# Patient Record
Sex: Female | Born: 1970 | Race: White | Hispanic: No | Marital: Married | Smoking: Never smoker
Health system: Southern US, Community
[De-identification: ages and names within clinical notes are randomized; demographics above are authoritative.]

## PROBLEM LIST (undated history)

## (undated) DIAGNOSIS — K219 Gastro-esophageal reflux disease without esophagitis: Secondary | ICD-10-CM

## (undated) DIAGNOSIS — E119 Type 2 diabetes mellitus without complications: Secondary | ICD-10-CM

## (undated) DIAGNOSIS — J45909 Unspecified asthma, uncomplicated: Secondary | ICD-10-CM

## (undated) DIAGNOSIS — E785 Hyperlipidemia, unspecified: Secondary | ICD-10-CM

## (undated) DIAGNOSIS — I10 Essential (primary) hypertension: Secondary | ICD-10-CM

## (undated) DIAGNOSIS — F419 Anxiety disorder, unspecified: Secondary | ICD-10-CM

## (undated) HISTORY — DX: Hyperlipidemia, unspecified: E78.5

## (undated) HISTORY — PX: LAPAROSCOPIC OVARIAN: SHX5906

## (undated) HISTORY — DX: Essential (primary) hypertension: I10

---

## 2000-09-13 HISTORY — PX: APPENDECTOMY: SHX54

## 2005-10-18 ENCOUNTER — Observation Stay: Payer: Self-pay | Admitting: Certified Nurse Midwife

## 2005-11-03 ENCOUNTER — Observation Stay: Payer: Self-pay | Admitting: Obstetrics and Gynecology

## 2005-11-09 ENCOUNTER — Observation Stay: Payer: Self-pay | Admitting: Obstetrics and Gynecology

## 2005-11-10 ENCOUNTER — Observation Stay: Payer: Self-pay

## 2005-11-12 ENCOUNTER — Observation Stay: Payer: Self-pay | Admitting: Obstetrics and Gynecology

## 2005-11-14 ENCOUNTER — Observation Stay: Payer: Self-pay | Admitting: Obstetrics and Gynecology

## 2005-11-15 ENCOUNTER — Inpatient Hospital Stay: Payer: Self-pay | Admitting: Obstetrics and Gynecology

## 2005-11-19 ENCOUNTER — Ambulatory Visit: Payer: Self-pay | Admitting: Pediatrics

## 2007-07-11 ENCOUNTER — Ambulatory Visit: Payer: Self-pay

## 2007-07-13 ENCOUNTER — Ambulatory Visit: Payer: Self-pay

## 2007-08-08 ENCOUNTER — Ambulatory Visit: Payer: Self-pay | Admitting: General Surgery

## 2008-01-11 DIAGNOSIS — J309 Allergic rhinitis, unspecified: Secondary | ICD-10-CM | POA: Insufficient documentation

## 2008-02-05 ENCOUNTER — Ambulatory Visit: Payer: Self-pay | Admitting: General Surgery

## 2009-10-07 DIAGNOSIS — H6990 Unspecified Eustachian tube disorder, unspecified ear: Secondary | ICD-10-CM | POA: Insufficient documentation

## 2010-02-04 ENCOUNTER — Ambulatory Visit: Payer: Self-pay

## 2010-12-29 ENCOUNTER — Ambulatory Visit: Payer: Self-pay | Admitting: Obstetrics and Gynecology

## 2011-01-04 ENCOUNTER — Ambulatory Visit: Payer: Self-pay | Admitting: Obstetrics and Gynecology

## 2011-01-04 HISTORY — PX: ABDOMINAL HYSTERECTOMY: SHX81

## 2011-01-07 LAB — PATHOLOGY REPORT

## 2011-09-22 ENCOUNTER — Ambulatory Visit: Payer: Self-pay | Admitting: Family Medicine

## 2012-01-05 ENCOUNTER — Ambulatory Visit: Payer: Self-pay | Admitting: Family Medicine

## 2012-01-18 ENCOUNTER — Ambulatory Visit: Payer: Self-pay | Admitting: General Surgery

## 2012-09-26 ENCOUNTER — Ambulatory Visit: Payer: Self-pay | Admitting: Family Medicine

## 2013-02-08 ENCOUNTER — Other Ambulatory Visit: Payer: Self-pay | Admitting: General Surgery

## 2013-02-08 NOTE — Telephone Encounter (Signed)
There is a RX request for Prilosec. Treated for H pylori last year. Ask if she is still using this and her symptoms per Dr Evette Cristal.  She states that yes she has been on this medications and it helps her reflux.  She will check with Valetta Mole FNP ( her PCP) to see if they will begin to prescribe, if not she will call us back.

## 2013-02-09 NOTE — Telephone Encounter (Signed)
Patient seen in May 2013 with abdominal pain. Neg u/s, HIDA w/ EF of 95%, no symptoms during exam. Found to have elevated H pylori titer. RX w/ Omeprazole, Amox and Biaxin for infection. No phone f/u as requested. Patient will be asked to contact her primary care provider for further refills.  Cc: Lorie Phenix, MD

## 2013-08-03 ENCOUNTER — Ambulatory Visit: Payer: Self-pay | Admitting: Family Medicine

## 2013-10-08 ENCOUNTER — Ambulatory Visit: Payer: Self-pay | Admitting: Family Medicine

## 2013-11-23 LAB — LIPID PANEL
Cholesterol: 182 mg/dL (ref 0–200)
HDL: 51 mg/dL (ref 35–70)
LDL Cholesterol: 117 mg/dL
LDl/HDL Ratio: 4.6
Triglycerides: 72 mg/dL (ref 40–160)

## 2014-07-02 ENCOUNTER — Ambulatory Visit: Payer: Self-pay | Admitting: Family Medicine

## 2014-11-01 ENCOUNTER — Emergency Department: Payer: Self-pay | Admitting: Emergency Medicine

## 2014-11-04 LAB — HEMOGLOBIN A1C: Hgb A1c MFr Bld: 6.2 % — AB (ref 4.0–6.0)

## 2014-11-04 LAB — TSH: TSH: 2.22 u[IU]/mL (ref <=5.90)

## 2014-11-12 LAB — HM PAP SMEAR: HM PAP: NEGATIVE

## 2014-11-15 ENCOUNTER — Ambulatory Visit: Payer: Self-pay | Admitting: Family Medicine

## 2014-12-14 DIAGNOSIS — G2581 Restless legs syndrome: Secondary | ICD-10-CM | POA: Insufficient documentation

## 2014-12-14 DIAGNOSIS — E282 Polycystic ovarian syndrome: Secondary | ICD-10-CM | POA: Insufficient documentation

## 2014-12-14 DIAGNOSIS — F329 Major depressive disorder, single episode, unspecified: Secondary | ICD-10-CM | POA: Insufficient documentation

## 2014-12-14 DIAGNOSIS — J45909 Unspecified asthma, uncomplicated: Secondary | ICD-10-CM | POA: Insufficient documentation

## 2014-12-14 DIAGNOSIS — F32A Depression, unspecified: Secondary | ICD-10-CM | POA: Insufficient documentation

## 2014-12-14 DIAGNOSIS — E7800 Pure hypercholesterolemia, unspecified: Secondary | ICD-10-CM | POA: Insufficient documentation

## 2014-12-14 DIAGNOSIS — IMO0002 Reserved for concepts with insufficient information to code with codable children: Secondary | ICD-10-CM | POA: Insufficient documentation

## 2014-12-14 DIAGNOSIS — G4700 Insomnia, unspecified: Secondary | ICD-10-CM | POA: Insufficient documentation

## 2014-12-14 DIAGNOSIS — F419 Anxiety disorder, unspecified: Secondary | ICD-10-CM | POA: Insufficient documentation

## 2014-12-14 DIAGNOSIS — E288 Other ovarian dysfunction: Secondary | ICD-10-CM | POA: Insufficient documentation

## 2014-12-14 DIAGNOSIS — K219 Gastro-esophageal reflux disease without esophagitis: Secondary | ICD-10-CM | POA: Insufficient documentation

## 2014-12-14 DIAGNOSIS — Z8619 Personal history of other infectious and parasitic diseases: Secondary | ICD-10-CM | POA: Insufficient documentation

## 2014-12-14 DIAGNOSIS — R202 Paresthesia of skin: Secondary | ICD-10-CM | POA: Insufficient documentation

## 2014-12-14 DIAGNOSIS — F4320 Adjustment disorder, unspecified: Secondary | ICD-10-CM | POA: Insufficient documentation

## 2015-02-13 ENCOUNTER — Ambulatory Visit: Payer: Self-pay | Admitting: Family Medicine

## 2015-02-26 ENCOUNTER — Other Ambulatory Visit: Payer: Self-pay | Admitting: *Deleted

## 2015-02-26 MED ORDER — LOVASTATIN 20 MG PO TABS: 20.0000 mg | ORAL_TABLET | Freq: Every day | ORAL | Status: DC

## 2015-02-26 NOTE — Telephone Encounter (Signed)
Refill request for Lovastatin 20 mg Last filled by MD on- 07/09/2014 #30 x5 refills Last Appt: 11/12/2014 Next Appt: none Please advise refill?

## 2015-03-03 ENCOUNTER — Other Ambulatory Visit: Payer: Self-pay

## 2015-03-03 DIAGNOSIS — E7800 Pure hypercholesterolemia, unspecified: Secondary | ICD-10-CM

## 2015-03-03 MED ORDER — LOVASTATIN 20 MG PO TABS: 20.0000 mg | ORAL_TABLET | Freq: Every day | ORAL | Status: DC

## 2015-05-12 ENCOUNTER — Ambulatory Visit: Payer: Self-pay | Admitting: Family Medicine

## 2015-05-13 ENCOUNTER — Encounter: Payer: Self-pay | Admitting: Family Medicine

## 2015-05-13 ENCOUNTER — Ambulatory Visit (INDEPENDENT_AMBULATORY_CARE_PROVIDER_SITE_OTHER): Payer: BLUE CROSS/BLUE SHIELD | Admitting: Family Medicine

## 2015-05-13 VITALS — BP 102/74 | HR 80 | Temp 98.1°F | Resp 16 | Ht 63.25 in | Wt 250.0 lb

## 2015-05-13 DIAGNOSIS — R7309 Other abnormal glucose: Secondary | ICD-10-CM | POA: Diagnosis not present

## 2015-05-13 DIAGNOSIS — F32A Depression, unspecified: Secondary | ICD-10-CM

## 2015-05-13 DIAGNOSIS — E288 Other ovarian dysfunction: Secondary | ICD-10-CM | POA: Diagnosis not present

## 2015-05-13 DIAGNOSIS — F329 Major depressive disorder, single episode, unspecified: Secondary | ICD-10-CM

## 2015-05-13 DIAGNOSIS — R7303 Prediabetes: Secondary | ICD-10-CM

## 2015-05-13 DIAGNOSIS — IMO0002 Reserved for concepts with insufficient information to code with codable children: Secondary | ICD-10-CM

## 2015-05-13 MED ORDER — ALPRAZOLAM 0.5 MG PO TABS: 0.5000 mg | ORAL_TABLET | Freq: Three times a day (TID) | ORAL | Status: AC | PRN

## 2015-05-13 MED ORDER — CITALOPRAM HYDROBROMIDE 20 MG PO TABS: 20.0000 mg | ORAL_TABLET | Freq: Every day | ORAL | Status: DC

## 2015-05-13 MED ORDER — METFORMIN HCL 500 MG PO TABS: 500.0000 mg | ORAL_TABLET | Freq: Two times a day (BID) | ORAL | Status: DC

## 2015-05-13 NOTE — Progress Notes (Signed)
Subjective:    Patient ID: Tina Henderson, female    DOB: 08-06-1971, 44 y.o.   MRN: [id number]  Anxiety Presents for follow-up visit. Symptoms include insomnia and restlessness. Patient reports no chest pain, compulsions, confusion, decreased concentration, depressed mood, dizziness, dry mouth, excessive worry, feeling of choking, hyperventilation, irritability, malaise, muscle tension, nausea, nervous/anxious behavior, obsessions, palpitations, panic, shortness of breath or suicidal ideas. The quality of sleep is poor. Nighttime awakenings: several.   Treatments tried: Citalopram and Alprazolam (pt needs refills on both of these medications) Compliance with prior treatments has been variable (Pt out of Alprazolam rx).    Obesity: Patient complains of obesity. Patient cites health as reasons for wanting to lose weight. Current BMI is 44. Pt asking if there is any OTC supplements, etc she can try for this problems.  Obesity History Weight in late teens: 125 lb. Period of greatest weight gain: 30 lb during early adult years (after having son) Lowest adult weight: unsure Highest adult weight: 250 lb (now) Amount of time at present weight: Has been in the 200's since 2000.   History of Weight Loss Efforts Greatest amount of weight lost: 18 lb over 12 months Amount of time that loss was maintained: "not all that long"  Circumstances associated with regain of weight: changed jobs, now has desk job Successful weight loss techniques attempted: [employer] Unsuccessful weight loss techniques attempted: [employer]  Current Exercise Habits walking around the neighborhood "a couple times a week"  Current Eating Habits Number of regular meals per day: 3 Number of snacking episodes per day: 1 Who shops for food? patient Who prepares food? patient Who eats with patient? patient, daughter and spouse Binge behavior?: In the past, after losing parents, but has since ceased this  behavior. Purge behavior? no Anorexic behavior? no Eating precipitated by stress? yes - after parents' deaths Guilt feelings associated with eating? no     Review of Systems  Constitutional: Negative for irritability.  Respiratory: Negative for shortness of breath.   Cardiovascular: Negative for chest pain and palpitations.  Gastrointestinal: Negative for nausea.  Neurological: Negative for dizziness.  Psychiatric/Behavioral: Negative for suicidal ideas, confusion and decreased concentration. The patient has insomnia. The patient is not nervous/anxious.    BP 102/74 mmHg  Pulse 80  Temp(Src) 98.1 F (36.7 C) (Oral)  Resp 16  Ht 5' 3.25" (1.607 m)  Wt 250 lb (113.399 kg)  BMI 43.91 kg/m2   Patient Active Problem List   Diagnosis Date Noted  . Adaptation reaction 12/14/2014  . Anxiety disorder 12/14/2014  . Airway hyperreactivity 12/14/2014  . Body mass index of 60 or higher 12/14/2014  . History of chicken pox 12/14/2014  . Clinical depression 12/14/2014  . 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 Acid reflux 12/14/2014  . Hypercholesteremia 12/14/2014  . Cannot sleep 12/14/2014  . Burning or prickling sensation 12/14/2014  . Bilateral polycystic ovarian syndrome 12/14/2014  . Borderline diabetes 12/14/2014  . Restless leg 12/14/2014  . Irregular bleeding 04/02/2010  . Auditory tube disorder 10/07/2009  . Avitaminosis D 01/30/2009  . Allergic rhinitis 01/11/2008  . Essential (primary) hypertension 01/11/2008   No past medical history on file. Current Outpatient Prescriptions on File Prior to Visit  Medication Sig  . ALPRAZolam (XANAX) 0.5 MG tablet Take 1 tablet by mouth every 8 (eight) hours as needed.  . citalopram (CELEXA) 20 MG tablet Take 1 tablet by mouth daily.  . fluticasone (FLONASE) 50 MCG/ACT nasal spray Place 2 sprays into  the nose daily.  Marland Kitchen lovastatin (MEVACOR) 20 MG tablet Take 1 tablet (20 mg total) by mouth daily.  Marland Kitchen  triamterene-hydrochlorothiazide (MAXZIDE-25) 37.5-25 MG per tablet Take 1 tablet by mouth daily.  . Vitamin D, Ergocalciferol, (DRISDOL) 50000 UNITS CAPS capsule Take 1 capsule by mouth every 30 (thirty) days.  Marland Kitchen albuterol (PROVENTIL HFA;VENTOLIN HFA) 108 (90 BASE) MCG/ACT inhaler Inhale 2 puffs into the lungs every 6 (six) hours as needed. May also take 30-45 minutes before exercise.  . mometasone-formoterol (DULERA) 200-5 MCG/ACT AERO Inhale 2 puffs into the lungs 2 (two) times daily.  Marland Kitchen omeprazole (PRILOSEC) 40 MG capsule Take 1 capsule by mouth daily.   No current facility-administered medications on file prior to visit.   No Known Allergies Past Surgical History  Procedure Laterality Date  . Appendectomy  2002  . Abdominal hysterectomy  01/04/2011    Due to Endometriosis   Social History   Social History  . Marital Status: Married    Spouse Name: N/A  . Number of Children: N/A  . Years of Education: N/A   Occupational History  . Not on file.   Social History Main Topics  . Smoking status: Never Smoker   . Smokeless tobacco: Never Used  . Alcohol Use: No  . Drug Use: No  . Sexual Activity: Not on file   Other Topics Concern  . Not on file   Social History Narrative   Family History  Problem Relation Age of Onset  . COPD Mother   . Lung cancer Father   . Bone cancer Father        Objective:   Physical Exam  Constitutional: She is oriented to person, place, and time. She appears well-developed and well-nourished.  Cardiovascular: Normal rate.   Pulmonary/Chest: Effort normal and breath sounds normal.  Neurological: She is alert and oriented to person, place, and time.   BP 102/74 mmHg  Pulse 80  Temp(Src) 98.1 F (36.7 C) (Oral)  Resp 16  Ht 5' 3.25" (1.607 m)  Wt 250 lb (113.399 kg)  BMI 43.91 kg/m2     Assessment & Plan:  1. 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  2. Borderline diabetes Worsening. Concerned about weight loss. -  metFORMIN (GLUCOPHAGE) 500 MG tablet; Take 1 tablet (500 mg total) by mouth 2 (two) times daily with a meal.  Dispense: 180 tablet; Refill: 3  3. Body mass index of 60 or higher Motivated to loose weight.  Will continue to try to loose weight.   4. Clinical depression Condition is stable. Please continue current medication and  plan of care as noted.   - ALPRAZolam (XANAX) 0.5 MG tablet; Take 1 tablet (0.5 mg total) by mouth 3 (three) times daily as needed.  Dispense: 60 tablet; Refill: 0 - citalopram (CELEXA) 20 MG tablet; Take 1 tablet (20 mg total) by mouth daily.  Dispense: 90 tablet; Refill: 3  Margarita Rana, MD

## 2015-06-10 ENCOUNTER — Telehealth: Payer: Self-pay | Admitting: Family Medicine

## 2015-06-10 NOTE — Telephone Encounter (Signed)
Please triage. Really not sure why this is happening. Can make follow up ov to discuss. Does patient think related to medication?

## 2015-06-10 NOTE — Telephone Encounter (Signed)
Pt is having a lot of sweating and is hot all the time.  Latest Blood pressure has been 138/100 then dropped down 1 hr later 122/88.  Please advise.  Her call back is [phone]  Thanks teri

## 2015-06-11 NOTE — Telephone Encounter (Signed)
I spoke with Ms Brandstetter, she wasn't sure if Metformin was causing her symptoms.  She says she checks her occasionally and it is usually normal.  I offered to schedule a follow up appointment, but she says she has a follow up scheduled at the end of October and she would like to try to wait until then.  I advised her to keep a check on her blood pressure and if it goes up again to make an appointment sooner.    Thanks,   -Mickel Baas

## 2015-06-11 NOTE — Telephone Encounter (Signed)
LMTCB 06/11/2015  Thanks,   -Mickel Baas

## 2015-06-11 NOTE — Telephone Encounter (Signed)
Pt is returning call.  IH#[phone]/[id number]

## 2015-07-08 ENCOUNTER — Ambulatory Visit: Payer: BLUE CROSS/BLUE SHIELD | Admitting: Family Medicine

## 2015-07-15 ENCOUNTER — Ambulatory Visit: Payer: BLUE CROSS/BLUE SHIELD | Admitting: Family Medicine

## 2015-07-16 ENCOUNTER — Ambulatory Visit: Payer: BLUE CROSS/BLUE SHIELD | Admitting: Family Medicine

## 2015-08-05 ENCOUNTER — Other Ambulatory Visit: Payer: Self-pay | Admitting: Family Medicine

## 2015-08-05 DIAGNOSIS — F32A Depression, unspecified: Secondary | ICD-10-CM

## 2015-08-05 DIAGNOSIS — F329 Major depressive disorder, single episode, unspecified: Secondary | ICD-10-CM

## 2015-09-17 ENCOUNTER — Ambulatory Visit: Payer: BLUE CROSS/BLUE SHIELD | Admitting: Physician Assistant

## 2015-10-06 ENCOUNTER — Ambulatory Visit (INDEPENDENT_AMBULATORY_CARE_PROVIDER_SITE_OTHER): Payer: BLUE CROSS/BLUE SHIELD | Admitting: Family Medicine

## 2015-10-06 ENCOUNTER — Encounter: Payer: Self-pay | Admitting: Family Medicine

## 2015-10-06 VITALS — BP 110/80 | HR 68 | Temp 97.9°F | Resp 16 | Wt 258.4 lb

## 2015-10-06 DIAGNOSIS — S39012A Strain of muscle, fascia and tendon of lower back, initial encounter: Secondary | ICD-10-CM

## 2015-10-06 MED ORDER — CYCLOBENZAPRINE HCL 5 MG PO TABS: 5.0000 mg | ORAL_TABLET | Freq: Three times a day (TID) | ORAL | Status: DC | PRN

## 2015-10-06 MED ORDER — HYDROCODONE-ACETAMINOPHEN 5-325 MG PO TABS: ORAL_TABLET | ORAL | Status: DC

## 2015-10-06 NOTE — Progress Notes (Signed)
Subjective:     Patient ID: Tina Henderson, female   DOB: 31-Mar-1971, 45 y.o.   MRN: [id number]  HPI  Chief Complaint  Patient presents with  . Back Pain    Patient comes in office today with concerns of lower back pain since the weekend. Patient described pain as achy and states that it is shooting down her left hip and leg. Patient has tried otc Tylenol with no relief,  Denies specific injury or hx of back surgery. Her job does not involve lifting.   Review of Systems     Objective:   Physical Exam  Constitutional: She appears well-developed and well-nourished. She appears distressed (mild back pain when changing positions.).  Musculoskeletal:  Muscle strength in lower extremities 5/5. SLR's to 90 degrees without radiation of back pain.localizes pain to her lower lumbar vertebral area-non-tender to the touch.       Assessment:    1. Low back strain, initial encounter - HYDROcodone-acetaminophen (NORCO/VICODIN) 5-325 MG tablet; One every 4-6 hours as needed for pain  Dispense: 28 tablet; Refill: 0 - cyclobenzaprine (FLEXERIL) 5 MG tablet; Take 1 tablet (5 mg total) by mouth 3 (three) times daily as needed for muscle spasms.  Dispense: 30 tablet; Refill: 0    Plan:    Discussed scheduling nsaid's.

## 2015-10-06 NOTE — Patient Instructions (Signed)
Discussed use to two  Aleve twice daily with food.

## 2015-10-07 ENCOUNTER — Other Ambulatory Visit: Payer: Self-pay

## 2015-10-07 DIAGNOSIS — I10 Essential (primary) hypertension: Secondary | ICD-10-CM

## 2015-10-07 MED ORDER — TRIAMTERENE-HCTZ 37.5-25 MG PO TABS: 1.0000 | ORAL_TABLET | Freq: Every day | ORAL | Status: DC

## 2015-10-20 ENCOUNTER — Other Ambulatory Visit: Payer: Self-pay | Admitting: Family Medicine

## 2015-10-20 DIAGNOSIS — Z1231 Encounter for screening mammogram for malignant neoplasm of breast: Secondary | ICD-10-CM

## 2015-11-10 ENCOUNTER — Ambulatory Visit
Admission: RE | Admit: 2015-11-10 | Discharge: 2015-11-10 | Disposition: A | Discharge status: Home / Self Care | Payer: BLUE CROSS/BLUE SHIELD | Source: Ambulatory Visit | Attending: Family Medicine | Admitting: Family Medicine

## 2015-11-10 ENCOUNTER — Ambulatory Visit (INDEPENDENT_AMBULATORY_CARE_PROVIDER_SITE_OTHER): Payer: BLUE CROSS/BLUE SHIELD | Admitting: Family Medicine

## 2015-11-10 ENCOUNTER — Encounter: Payer: Self-pay | Admitting: Family Medicine

## 2015-11-10 VITALS — BP 118/82 | HR 76 | Temp 98.4°F | Resp 16 | Wt 260.0 lb

## 2015-11-10 DIAGNOSIS — M545 Low back pain, unspecified: Secondary | ICD-10-CM

## 2015-11-10 DIAGNOSIS — S39012A Strain of muscle, fascia and tendon of lower back, initial encounter: Secondary | ICD-10-CM

## 2015-11-10 DIAGNOSIS — M4046 Postural lordosis, lumbar region: Secondary | ICD-10-CM | POA: Diagnosis not present

## 2015-11-10 LAB — POCT URINALYSIS DIPSTICK
Blood, UA: NEGATIVE
Glucose, UA: NEGATIVE
KETONES UA: NEGATIVE
LEUKOCYTES UA: NEGATIVE
NITRITE UA: NEGATIVE
Spec Grav, UA: 1.02
Urobilinogen, UA: 0.2
pH, UA: 7.5

## 2015-11-10 MED ORDER — CYCLOBENZAPRINE HCL 5 MG PO TABS: 5.0000 mg | ORAL_TABLET | Freq: Every day | ORAL | Status: DC

## 2015-11-10 NOTE — Progress Notes (Signed)
Subjective:    Patient ID: Tina Henderson, female    DOB: 1971/05/09, 45 y.o.   MRN: [id number]  Back Pain This is a new problem. The current episode started in the past 7 days. The problem is unchanged. The pain is present in the gluteal (right side). The quality of the pain is described as aching. The pain does not radiate. The pain is at a severity of 5/10. The pain is moderate. The symptoms are aggravated by sitting. Associated symptoms include abdominal pain and headaches (intermittent). Pertinent negatives include no bladder incontinence, bowel incontinence, chest pain, dysuria, fever, leg pain, pelvic pain, tingling or weakness. She has tried NSAIDs for the symptoms. The treatment provided no relief.  Pt was seen by Mikki Santee recently for low back strain, and was prescribed Flexeril and Norco for this problem. Pt is no longer taking these mediations, because the pain does not feel similar.    Review of Systems  Constitutional: Positive for fatigue. Negative for fever, chills and diaphoresis.  Cardiovascular: Negative for chest pain.  Gastrointestinal: Positive for abdominal pain. Negative for bowel incontinence.  Genitourinary: Positive for frequency (possibly due to BP medication). Negative for bladder incontinence, dysuria and pelvic pain.  Musculoskeletal: Positive for back pain.  Neurological: Positive for headaches (intermittent). Negative for tingling and weakness.   BP 118/82 mmHg  Pulse 76  Temp(Src) 98.4 F (36.9 C) (Oral)  Resp 16  Wt 260 lb (117.935 kg)   Patient Active Problem List   Diagnosis Date Noted  . Adaptation reaction 12/14/2014  . Anxiety disorder 12/14/2014  . Airway hyperreactivity 12/14/2014  . Body mass index of 60 or higher ([place]) 12/14/2014  . History of chicken pox 12/14/2014  . Clinical depression 12/14/2014  . 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 Acid reflux 12/14/2014  . Hypercholesteremia 12/14/2014  . Cannot sleep 12/14/2014  .  Burning or prickling sensation 12/14/2014  . Bilateral polycystic ovarian syndrome 12/14/2014  . Borderline diabetes 12/14/2014  . Restless leg 12/14/2014  . Irregular bleeding 04/02/2010  . Auditory tube disorder 10/07/2009  . Avitaminosis D 01/30/2009  . Allergic rhinitis 01/11/2008  . Essential (primary) hypertension 01/11/2008   No past medical history on file. Current Outpatient Prescriptions on File Prior to Visit  Medication Sig  . albuterol (PROVENTIL HFA;VENTOLIN HFA) 108 (90 BASE) MCG/ACT inhaler Inhale 2 puffs into the lungs every 6 (six) hours as needed. May also take 30-45 minutes before exercise.  Marland Kitchen ALPRAZolam (XANAX) 0.5 MG tablet Take 1 tablet (0.5 mg total) by mouth 3 (three) times daily as needed.  . citalopram (CELEXA) 20 MG tablet TAKE ONE AND ONE-HALF TABLETS BY MOUTH ONCE DAILY  . fluticasone (FLONASE) 50 MCG/ACT nasal spray Place 2 sprays into the nose daily.  Marland Kitchen lovastatin (MEVACOR) 20 MG tablet Take 1 tablet (20 mg total) by mouth daily.  Marland Kitchen triamterene-hydrochlorothiazide (MAXZIDE-25) 37.5-25 MG tablet Take 1 tablet by mouth daily.  . Vitamin D, Ergocalciferol, (DRISDOL) 50000 UNITS CAPS capsule Take 1 capsule by mouth every 30 (thirty) days.  . cyclobenzaprine (FLEXERIL) 5 MG tablet Take 1 tablet (5 mg total) by mouth 3 (three) times daily as needed for muscle spasms. (Patient not taking: Reported on 11/10/2015)  . HYDROcodone-acetaminophen (NORCO/VICODIN) 5-325 MG tablet One every 4-6 hours as needed for pain (Patient not taking: Reported on 11/10/2015)  . omeprazole (PRILOSEC) 40 MG capsule Take 1 capsule by mouth daily. Reported on 11/10/2015   No current facility-administered medications on file prior to visit.   No  Known Allergies Past Surgical History  Procedure Laterality Date  . Appendectomy  2002  . Abdominal hysterectomy  01/04/2011    Due to Endometriosis   Social History   Social History  . Marital Status: Married    Spouse Name: N/A  . Number  of Children: N/A  . Years of Education: N/A   Occupational History  . Not on file.   Social History Main Topics  . Smoking status: Never Smoker   . Smokeless tobacco: Never Used  . Alcohol Use: No  . Drug Use: No  . Sexual Activity: Not on file   Other Topics Concern  . Not on file   Social History Narrative   Family History  Problem Relation Age of Onset  . COPD Mother   . Lung cancer Father   . Bone cancer Father        Objective:   Physical Exam  Constitutional: She is oriented to person, place, and time. She appears well-developed and well-nourished.  Abdominal: Soft. Bowel sounds are normal. There is no tenderness.  No CVA tenderness  Musculoskeletal: Normal range of motion.  Negative leg lifts. Normal strength of legs.  Neurological: She is alert and oriented to person, place, and time.  Psychiatric: She has a normal mood and affect. Her behavior is normal.   BP 118/82 mmHg  Pulse 76  Temp(Src) 98.4 F (36.9 C) (Oral)  Resp 16  Wt 260 lb (117.935 kg)     Assessment & Plan:  1. Right-sided low back pain without sciatica New problem. UA negative. Will obtain XRay. FU pending results.  - POCT urinalysis dipstick - DG Lumbar Spine 2-3 Views; Future Results for orders placed or performed in visit on 11/10/15  POCT urinalysis dipstick  Result Value Ref Range   Color, UA Amber    Clarity, UA Clear    Glucose, UA Negative    Bilirubin, UA Small    Ketones, UA Negative    Spec Grav, UA 1.020    Blood, UA Negative    pH, UA 7.5    Protein, UA Trace    Urobilinogen, UA 0.2    Nitrite, UA Negative    Leukocytes, UA Negative Negative     2. Back strain, initial encounter Recurrent. Restart Cyclobenzaprine and refer to PT as below. - Ambulatory referral to Physical Therapy - cyclobenzaprine (FLEXERIL) 5 MG tablet; Take 1 tablet (5 mg total) by mouth at bedtime.  Dispense: 30 tablet; Refill: 0  Patient seen and examined by Jerrell Belfast, MD, and note  scribed by Renaldo Fiddler, CMA.   I have reviewed the document for accuracy and completeness and I agree with above. Jerrell Belfast, MD   Margarita Rana, MD

## 2015-11-14 ENCOUNTER — Ambulatory Visit (INDEPENDENT_AMBULATORY_CARE_PROVIDER_SITE_OTHER): Payer: BLUE CROSS/BLUE SHIELD | Admitting: Family Medicine

## 2015-11-14 ENCOUNTER — Encounter: Payer: Self-pay | Admitting: Family Medicine

## 2015-11-14 VITALS — BP 128/84 | HR 96 | Temp 98.2°F | Resp 16 | Ht 63.0 in | Wt 260.0 lb

## 2015-11-14 DIAGNOSIS — F329 Major depressive disorder, single episode, unspecified: Secondary | ICD-10-CM

## 2015-11-14 DIAGNOSIS — I10 Essential (primary) hypertension: Secondary | ICD-10-CM

## 2015-11-14 DIAGNOSIS — E7800 Pure hypercholesterolemia, unspecified: Secondary | ICD-10-CM | POA: Diagnosis not present

## 2015-11-14 DIAGNOSIS — F32A Depression, unspecified: Secondary | ICD-10-CM

## 2015-11-14 DIAGNOSIS — Z0000 Encounter for general adult medical examination without abnormal findings: Secondary | ICD-10-CM | POA: Diagnosis not present

## 2015-11-14 MED ORDER — TRIAMTERENE-HCTZ 37.5-25 MG PO TABS: 1.0000 | ORAL_TABLET | Freq: Every day | ORAL | Status: DC

## 2015-11-14 MED ORDER — CITALOPRAM HYDROBROMIDE 20 MG PO TABS: 30.0000 mg | ORAL_TABLET | Freq: Every day | ORAL | Status: DC

## 2015-11-14 MED ORDER — VITAMIN D (ERGOCALCIFEROL) 1.25 MG (50000 UNIT) PO CAPS: 50000.0000 [IU] | ORAL_CAPSULE | ORAL | Status: DC

## 2015-11-14 MED ORDER — FLUTICASONE PROPIONATE 50 MCG/ACT NA SUSP: 2.0000 | Freq: Every day | NASAL | Status: DC

## 2015-11-14 MED ORDER — LOVASTATIN 20 MG PO TABS: 20.0000 mg | ORAL_TABLET | Freq: Every day | ORAL | Status: DC

## 2015-11-14 NOTE — Progress Notes (Signed)
Patient: Tina Henderson, Female    DOB: [birth year], 45 y.o.   MRN: [id number] Visit Date: 11/14/2015  Today's Provider: Margarita Rana, MD   Chief Complaint  Patient presents with  . Annual Exam   Subjective:    Annual physical exam Tina Henderson is a 45 y.o. female who presents today for health maintenance and complete physical. She feels well. She reports exercising never. She reports she is sleeping well.  Last CPE- 11/12/2014 Last Pap- 11/12/2014- Negative. HPV negative. Last Mammo- 11/15/2014- BI-RADS 1. Mammogram scheduled for 11/17/2015.  -----------------------------------------------------------------   Review of Systems  Constitutional: Negative.   HENT: Negative.   Eyes: Negative.   Respiratory: Negative.   Cardiovascular: Negative.   Gastrointestinal: Negative.   Endocrine: Negative.   Genitourinary: Negative.   Musculoskeletal: Negative.   Skin: Negative.   Allergic/Immunologic: Negative.   Neurological: Negative.   Hematological: Negative.   Psychiatric/Behavioral: Negative.     Social History      She  reports that she has never smoked. She has never used smokeless tobacco. She reports that she does not drink alcohol or use illicit drugs.       Social History   Social History  . Marital Status: Married    Spouse Name: Corene Cornea  . Number of Children: 2  . Years of Education: in college   Occupational History  . [employer]   Social History Main Topics  . Smoking status: Never Smoker   . Smokeless tobacco: Never Used  . Alcohol Use: No  . Drug Use: No  . Sexual Activity: Yes    Birth Control/ Protection: Surgical   Other Topics Concern  . None   Social History Narrative    No past medical history on file.   Patient Active Problem List   Diagnosis Date Noted  . Adaptation reaction 12/14/2014  . Anxiety disorder 12/14/2014  . Airway hyperreactivity 12/14/2014  . Body mass index of 60 or higher ([place])  12/14/2014  . History of chicken pox 12/14/2014  . Clinical depression 12/14/2014  . 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 Acid reflux 12/14/2014  . Hypercholesteremia 12/14/2014  . Cannot sleep 12/14/2014  . Burning or prickling sensation 12/14/2014  . Bilateral polycystic ovarian syndrome 12/14/2014  . Borderline diabetes 12/14/2014  . Restless leg 12/14/2014  . Irregular bleeding 04/02/2010  . Auditory tube disorder 10/07/2009  . Avitaminosis D 01/30/2009  . Allergic rhinitis 01/11/2008  . Essential (primary) hypertension 01/11/2008    Past Surgical History  Procedure Laterality Date  . Appendectomy  2002  . Abdominal hysterectomy  01/04/2011    Due to Endometriosis  . Laparoscopic ovarian      Family History        Family Status  Relation Status Death Age  . Mother Deceased   . Father Deceased         Her family history includes Bone cancer in her father; COPD in her mother; Lung cancer in her father.    No Known Allergies  Previous Medications   ALBUTEROL (PROVENTIL HFA;VENTOLIN HFA) 108 (90 BASE) MCG/ACT INHALER    Inhale 2 puffs into the lungs every 6 (six) hours as needed. May also take 30-45 minutes before exercise.   ALPRAZOLAM (XANAX) 0.5 MG TABLET    Take 1 tablet (0.5 mg total) by mouth 3 (three) times daily as needed.   CITALOPRAM (CELEXA) 20 MG TABLET    TAKE ONE AND ONE-HALF TABLETS BY MOUTH ONCE  DAILY   CYCLOBENZAPRINE (FLEXERIL) 5 MG TABLET    Take 1 tablet (5 mg total) by mouth at bedtime.   FLUTICASONE (FLONASE) 50 MCG/ACT NASAL SPRAY    Place 2 sprays into the nose daily.   LOVASTATIN (MEVACOR) 20 MG TABLET    Take 1 tablet (20 mg total) by mouth daily.   OMEPRAZOLE (PRILOSEC) 40 MG CAPSULE    Take 1 capsule by mouth daily. Reported on 11/10/2015   TRIAMTERENE-HYDROCHLOROTHIAZIDE (MAXZIDE-25) 37.5-25 MG TABLET    Take 1 tablet by mouth daily.   VITAMIN D, ERGOCALCIFEROL, (DRISDOL) 50000 UNITS CAPS CAPSULE    Take 1 capsule by mouth every  30 (thirty) days.    Patient Care Team: Margarita Rana, MD as PCP - General (Family Medicine) Robert Bellow, MD as Consulting Physician (General Surgery)     Objective:   Vitals: BP 128/84 mmHg  Pulse 96  Temp(Src) 98.2 F (36.8 C) (Oral)  Resp 16  Ht 5\' 3"  (1.6 m)  Wt 260 lb (117.935 kg)  BMI 46.07 kg/m2   Physical Exam  Constitutional: She is oriented to person, place, and time. She appears well-developed and well-nourished.  HENT:  Head: Normocephalic and atraumatic.  Right Ear: Tympanic membrane, external ear and ear canal normal.  Left Ear: Tympanic membrane, external ear and ear canal normal.  Nose: Nose normal.  Mouth/Throat: Uvula is midline, oropharynx is clear and moist and mucous membranes are normal.  Eyes: Conjunctivae, EOM and lids are normal. Pupils are equal, round, and reactive to light.  Neck: Trachea normal and normal range of motion. Neck supple. Carotid bruit is not present. No thyroid mass and no thyromegaly present.  Cardiovascular: Normal rate, regular rhythm and normal heart sounds.   Pulmonary/Chest: Effort normal and breath sounds normal.  Abdominal: Soft. Normal appearance and bowel sounds are normal. There is no hepatosplenomegaly. There is no tenderness.  Musculoskeletal: Normal range of motion.  Lymphadenopathy:    She has no cervical adenopathy.    She has no axillary adenopathy.  Neurological: She is alert and oriented to person, place, and time. She has normal strength. No cranial nerve deficit.  Skin: Skin is warm, dry and intact.  Psychiatric: She has a normal mood and affect. Her speech is normal and behavior is normal. Judgment and thought content normal. Cognition and memory are normal.     Depression Screen PHQ 2/9 Scores 11/14/2015  PHQ - 2 Score 0      Assessment & Plan:     Routine Health Maintenance and Physical Exam  Exercise Activities and Dietary recommendations Goals    None      Immunization History    Administered Date(s) Administered  . Influenza,inj,Quad PF,36+ Mos 06/14/2015  . Tdap 07/27/2011    Health Maintenance  Topic Date Due  . HIV Screening  03/13/1986  . PAP SMEAR  03/13/1992  . INFLUENZA VACCINE  12/12/2015 (Originally 04/14/2015)  . TETANUS/TDAP  07/26/2021      Discussed health benefits of physical activity, and encouraged her to engage in regular exercise appropriate for her age and condition.   1. Annual physical exam Stress importance of eating healthy and exercise as noted about with focus on significant weight loss. Strategies discussed today.    2. Hypercholesteremia Condition is stable. Please continue current medication and  plan of care as noted. Will check labs.   - Comprehensive metabolic panel - Lipid panel - CBC with Differential/Platelet - lovastatin (MEVACOR) 20 MG tablet; Take 1 tablet (20 mg  total) by mouth daily.  Dispense: 90 tablet; Refill: 3  3. Essential (primary) hypertension Condition is stable. Please continue current medication and  plan of care as noted.   - triamterene-hydrochlorothiazide (MAXZIDE-25) 37.5-25 MG tablet; Take 1 tablet by mouth daily.  Dispense: 90 tablet; Refill: 1  4. Clinical depression Condition is stable. Please continue current medication and  plan of care as noted.   - citalopram (CELEXA) 20 MG tablet; Take 1.5 tablets (30 mg total) by mouth daily.  Dispense: 135 tablet; Refill: 3   Patient was seen and examined by Jerrell Belfast, MD, and note scribed by Renaldo Fiddler, CMA. I have reviewed the document for accuracy and completeness and I agree with above. Jerrell Belfast, MD   Margarita Rana, MD    --------------------------------------------------------------------

## 2015-11-17 ENCOUNTER — Ambulatory Visit
Admission: RE | Admit: 2015-11-17 | Discharge: 2015-11-17 | Disposition: A | Discharge status: Home / Self Care | Payer: BLUE CROSS/BLUE SHIELD | Source: Ambulatory Visit | Attending: Family Medicine | Admitting: Family Medicine

## 2015-11-17 DIAGNOSIS — Z1231 Encounter for screening mammogram for malignant neoplasm of breast: Secondary | ICD-10-CM | POA: Insufficient documentation

## 2015-11-18 ENCOUNTER — Encounter: Payer: Self-pay | Admitting: Physical Therapy

## 2015-11-18 ENCOUNTER — Other Ambulatory Visit: Payer: Self-pay | Admitting: Family Medicine

## 2015-11-18 ENCOUNTER — Ambulatory Visit: Payer: BLUE CROSS/BLUE SHIELD | Attending: Family Medicine | Admitting: Physical Therapy

## 2015-11-18 DIAGNOSIS — M6281 Muscle weakness (generalized): Secondary | ICD-10-CM

## 2015-11-18 DIAGNOSIS — M2560 Stiffness of unspecified joint, not elsewhere classified: Secondary | ICD-10-CM

## 2015-11-18 DIAGNOSIS — M545 Low back pain: Secondary | ICD-10-CM | POA: Insufficient documentation

## 2015-11-18 DIAGNOSIS — R928 Other abnormal and inconclusive findings on diagnostic imaging of breast: Secondary | ICD-10-CM

## 2015-11-18 NOTE — Therapy (Addendum)
[facility] [address] [place], [place], [address] Phone: [phone]   Fax:  [phone]  Physical Therapy Evaluation  Patient Details  Name: Tina Henderson MRN: [id number] Date of Birth: [birth year] Referring Provider: Dr. Venia Minks  Encounter Date: 11/18/2015    History reviewed. No pertinent past medical history.  Past Surgical History  Procedure Laterality Date  . Appendectomy  2002  . Abdominal hysterectomy  01/04/2011    Due to Endometriosis  . Laparoscopic ovarian      There were no vitals filed for this visit.  Visit Diagnosis:  Low back pain without sciatica, unspecified back pain laterality  Muscle weakness  Joint stiffness of spine        Standing lumbar flexion: 76 deg.      Extension: WNL      Rotn.: WNL  B LE muscle strength grossly: 5/5 MMT (no deficits or increase c/o pain reported).   Pt. Sleeps in sidelying position with pillow between knees. No LLD.  Core muscle control              PT Long Term Goals - 11/19/15 1752    PT LONG TERM GOAL #1   Title Pt. will decrease Modified Oswestry to <15% to improve self-perceived disability .   Baseline MODI: 26%   Time 4   Period Weeks   Status New   PT LONG TERM GOAL #2   Title Pt. will report tenderness with STM/ palpation to lumbar paraspinals/ spine to improve pain-free mobility.    Time 4   Period Weeks   Status New   PT LONG TERM GOAL #3   Title Pt. will begin daily walking program with no c/o back pain.    Time 4   Period Weeks   Status New   PT LONG TERM GOAL #4   Title Pt. I with HEP to increase core stability to promote independent gym based ex. program.     Time 4   Period Weeks   Status New           Plan - 11/29/15 1742    Clinical Impression Statement Pt. is a 45 y/o female with c/o 2/10 R sided low back pain.  Pt. reports increase pain with lumbar flexion over past 2 weeks.  Lumbar AROM WNL (all planes) with increase R  sided LBP with 76 deg. flexion.  B LE muscle strength grossly 5/5 MMT (no increase c/o pain).  Good pelvic alignment but moderate lumbar hypomobility. noted with prone grade II-III mobs.  (+) tenderness with deep palpation to lumbar spine/ paraspinals.  Pt. is currently not exercising or participating with walking program.  Pt. will benefit from short-term PT to increase lumbar mobility/ core stability to improve pain-free mobility/ walking.     Pt will benefit from skilled therapeutic intervention in order to improve on the following deficits Decreased strength;Improper body mechanics;Difficulty walking;Decreased mobility;Hypomobility;Impaired flexibility;Pain   Rehab Potential Good   PT Frequency 1x / week   PT Duration 4 weeks   PT Treatment/Interventions Aquatic Therapy;Cryotherapy;Moist Heat;Patient/family education;Therapeutic exercise;Therapeutic activities;Functional mobility training;Manual techniques;Dry needling   PT Next Visit Plan Recheck HEP/ focus on manual tx./ STM   PT Home Exercise Plan See handouts         Problem List Patient Active Problem List   Diagnosis Date Noted  . Adaptation reaction 12/14/2014  . Anxiety disorder 12/14/2014  . Airway hyperreactivity 12/14/2014  . Body mass index of 60 or higher ([place])  12/14/2014  . History of chicken pox 12/14/2014  . Clinical depression 12/14/2014  . 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 Acid reflux 12/14/2014  . Hypercholesteremia 12/14/2014  . Cannot sleep 12/14/2014  . Burning or prickling sensation 12/14/2014  . Bilateral polycystic ovarian syndrome 12/14/2014  . Borderline diabetes 12/14/2014  . Restless leg 12/14/2014  . Irregular bleeding 04/02/2010  . Auditory tube disorder 10/07/2009  . Avitaminosis D 01/30/2009  . Allergic rhinitis 01/11/2008  . Essential (primary) hypertension 01/11/2008   Pura Spice, PT, DPT # [phone]   11/29/2015, 5:55 PM  [facility]  [facility] [address] [place], [place], [address] Phone: [phone]   Fax:  [phone]  Name: Tina Henderson MRN: [id number] Date of Birth: 02-Jun-1971

## 2015-11-29 NOTE — Addendum Note (Signed)
Addended by: Pura Spice on: 11/29/2015 05:59 PM   Modules accepted: Orders

## 2015-12-11 ENCOUNTER — Ambulatory Visit
Admission: RE | Admit: 2015-12-11 | Discharge: 2015-12-11 | Disposition: A | Discharge status: Home / Self Care | Payer: BLUE CROSS/BLUE SHIELD | Source: Ambulatory Visit | Attending: Family Medicine | Admitting: Family Medicine

## 2015-12-11 DIAGNOSIS — R928 Other abnormal and inconclusive findings on diagnostic imaging of breast: Secondary | ICD-10-CM | POA: Insufficient documentation

## 2016-05-13 ENCOUNTER — Encounter: Payer: Self-pay | Admitting: Family Medicine

## 2016-05-13 ENCOUNTER — Ambulatory Visit (INDEPENDENT_AMBULATORY_CARE_PROVIDER_SITE_OTHER): Payer: BLUE CROSS/BLUE SHIELD | Admitting: Family Medicine

## 2016-05-13 VITALS — BP 136/94 | HR 90 | Temp 98.2°F | Resp 16 | Wt 266.6 lb

## 2016-05-13 DIAGNOSIS — R202 Paresthesia of skin: Secondary | ICD-10-CM | POA: Diagnosis not present

## 2016-05-13 DIAGNOSIS — E7800 Pure hypercholesterolemia, unspecified: Secondary | ICD-10-CM | POA: Diagnosis not present

## 2016-05-13 DIAGNOSIS — I10 Essential (primary) hypertension: Secondary | ICD-10-CM | POA: Diagnosis not present

## 2016-05-13 DIAGNOSIS — N951 Menopausal and female climacteric states: Secondary | ICD-10-CM | POA: Diagnosis not present

## 2016-05-13 NOTE — Progress Notes (Signed)
Subjective:     Patient ID: Tina Henderson, female   DOB: 05/25/1971, 45 y.o.   MRN: [id number]  HPI  Chief Complaint  Patient presents with  . Hypertension    Patient comes back in office today for follow up, patients last office visit was 11/14/15. At last office patients blood pressure reading was 128/84 she was advised to continue medication. Patient reports today she has had headaches and nausea, her blood pressure reading this morning was 160/120. Patient reports good compliance with medication.   Also reports hand swelling and tingling. 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 States she has had hysterectomy and feels hot with sweats a lot of the time: " I keep a fan on me at work."   Review of Systems     Objective:   Physical Exam  Constitutional: She appears well-developed and well-nourished. No distress.  Cardiovascular: Normal rate and regular rhythm.   Pulmonary/Chest: Breath sounds normal.  Musculoskeletal: She exhibits no edema (of lower extremities).  Grip strength 5/5. Phalen's test positive for bilateral hand tingling-all fingers       Assessment:    1. Paresthesias - CBC with Differential/Platelet - T4, free - TSH  2. Hypercholesteremia - Lipid panel  3. Essential (primary) hypertension - Comprehensive metabolic panel  4. Hot flushes, perimenopausal - FSH - LH    Plan:    Further f/u pending lab work.

## 2016-05-15 LAB — FOLLICLE STIMULATING HORMONE: FSH: 7 m[IU]/mL

## 2016-05-15 LAB — CBC WITH DIFFERENTIAL/PLATELET
BASOS ABS: 0 10*3/uL (ref 0.0–0.2)
Basos: 0 %
EOS (ABSOLUTE): 0.2 10*3/uL (ref 0.0–0.4)
Eos: 2 %
HEMATOCRIT: 40 % (ref 34.0–46.6)
HEMOGLOBIN: 13 g/dL (ref 11.1–15.9)
Immature Grans (Abs): 0.1 10*3/uL (ref 0.0–0.1)
Immature Granulocytes: 1 %
Lymphocytes Absolute: 2.4 10*3/uL (ref 0.7–3.1)
Lymphs: 25 %
MCH: 28.1 pg (ref 26.6–33.0)
MCHC: 32.5 g/dL (ref 31.5–35.7)
MCV: 87 fL (ref 79–97)
MONOCYTES: 7 %
MONOS ABS: 0.6 10*3/uL (ref 0.1–0.9)
NEUTROS ABS: 6.2 10*3/uL (ref 1.4–7.0)
Neutrophils: 65 %
Platelets: 270 10*3/uL (ref 150–379)
RBC: 4.62 x10E6/uL (ref 3.77–5.28)
RDW: 13.9 % (ref 12.3–15.4)
WBC: 9.5 10*3/uL (ref 3.4–10.8)

## 2016-05-15 LAB — COMPREHENSIVE METABOLIC PANEL
A/G RATIO: 1.6 (ref 1.2–2.2)
ALT: 29 IU/L (ref 0–32)
AST: 22 IU/L (ref 0–40)
Albumin: 4.2 g/dL (ref 3.5–5.5)
Alkaline Phosphatase: 90 IU/L (ref 39–117)
BUN/Creatinine Ratio: 22 (ref 9–23)
BUN: 13 mg/dL (ref 6–24)
Bilirubin Total: 0.3 mg/dL (ref 0.0–1.2)
CALCIUM: 9.1 mg/dL (ref 8.7–10.2)
CO2: 25 mmol/L (ref 18–29)
CREATININE: 0.6 mg/dL (ref 0.57–1.00)
Chloride: 98 mmol/L (ref 96–106)
GFR, EST AFRICAN AMERICAN: 127 mL/min/{1.73_m2} (ref >59)
GFR, EST NON AFRICAN AMERICAN: 110 mL/min/{1.73_m2} (ref >59)
Globulin, Total: 2.6 g/dL (ref 1.5–4.5)
Glucose: 189 mg/dL — ABNORMAL HIGH (ref 65–99)
Potassium: 4.3 mmol/L (ref 3.5–5.2)
Sodium: 136 mmol/L (ref 134–144)
TOTAL PROTEIN: 6.8 g/dL (ref 6.0–8.5)

## 2016-05-15 LAB — LIPID PANEL
CHOL/HDL RATIO: 3 ratio (ref 0.0–4.4)
CHOLESTEROL TOTAL: 161 mg/dL (ref 100–199)
HDL: 54 mg/dL (ref >39)
LDL CALC: 89 mg/dL (ref 0–99)
TRIGLYCERIDES: 92 mg/dL (ref 0–149)
VLDL Cholesterol Cal: 18 mg/dL (ref 5–40)

## 2016-05-15 LAB — TSH: TSH: 2.56 u[IU]/mL (ref 0.450–4.500)

## 2016-05-15 LAB — LUTEINIZING HORMONE: LH: 12.5 m[IU]/mL

## 2016-05-15 LAB — T4, FREE: Free T4: 0.9 ng/dL (ref 0.82–1.77)

## 2016-05-18 ENCOUNTER — Telehealth: Payer: Self-pay

## 2016-05-18 NOTE — Telephone Encounter (Signed)
Patient has been advised. KW 

## 2016-05-18 NOTE — Telephone Encounter (Signed)
Pt returned your call.   [place]

## 2016-05-18 NOTE — Telephone Encounter (Signed)
LMTCB-KW 

## 2016-05-18 NOTE — Telephone Encounter (Signed)
-----   Message from Carmon Ginsberg, [place] sent at 05/18/2016  7:40 AM EDT ----- Labs look of except sugar is high. We will add an additional lab to check on your 3 month average sugar (Please add A1C to current draw)

## 2016-05-18 NOTE — Telephone Encounter (Signed)
Stay on current medication: your kidney function was fine. May come in for a nurse bp check this week pending her A1C.

## 2016-05-18 NOTE — Telephone Encounter (Signed)
patient has been advised and lab added patients wants to know what labs show about blood pressure, and if your changing medication.KW

## 2016-05-19 ENCOUNTER — Telehealth: Payer: Self-pay

## 2016-05-19 LAB — SPECIMEN STATUS REPORT

## 2016-05-19 LAB — HGB A1C W/O EAG: HEMOGLOBIN A1C: 7.2 % — AB (ref 4.8–5.6)

## 2016-05-19 NOTE — Telephone Encounter (Signed)
-----   Message from Carmon Ginsberg, [place] sent at 05/19/2016  7:57 AM EDT ----- Your sugar is in diabetic range. Please schedule office visit to discuss.

## 2016-05-19 NOTE — Telephone Encounter (Signed)
Patient has been advised an appt has been made of 05/20/16.KW

## 2016-05-20 ENCOUNTER — Ambulatory Visit (INDEPENDENT_AMBULATORY_CARE_PROVIDER_SITE_OTHER): Payer: BLUE CROSS/BLUE SHIELD | Admitting: Family Medicine

## 2016-05-20 ENCOUNTER — Encounter: Payer: Self-pay | Admitting: Family Medicine

## 2016-05-20 VITALS — BP 118/82 | HR 88 | Temp 98.4°F | Wt 267.0 lb

## 2016-05-20 DIAGNOSIS — E119 Type 2 diabetes mellitus without complications: Secondary | ICD-10-CM | POA: Insufficient documentation

## 2016-05-20 MED ORDER — METFORMIN HCL 500 MG PO TABS: 500.0000 mg | ORAL_TABLET | Freq: Two times a day (BID) | ORAL | 1 refills | Status: DC

## 2016-05-20 NOTE — Progress Notes (Signed)
Subjective:     Patient ID: Tina Henderson, female   DOB: 1971/04/09, 45 y.o.   MRN: [id number]  HPI  Chief Complaint  Patient presents with  . Diabetes    New onset. Last A1C was 05/14/2016 and was 7.2%.  States she has been on metformin in the past for PCOS and tolerated it. Currently does not exercise: "I had lost 30 # in the past but have gained it back". Reports consumption of sweet beverage: 2 glasses of sweet tea and up to three 16 oz sodas daily. Current sx include paresthesias, mild hand swelling, and sweats. Cholesterol currently controlled by lovastatin.   Review of Systems     Objective:   Physical Exam  Constitutional: She appears well-developed and well-nourished. Distressed: mildly upset over the diagnosis.       Assessment:    1. Type 2 diabetes mellitus without complication, without long-term current use of insulin (HCC) - metFORMIN (GLUCOPHAGE) 500 MG tablet; Take 1 tablet (500 mg total) by mouth 2 (two) times daily with a meal.  Dispense: 60 tablet; Refill: 1 - Amb ref to [facility]    Plan:    Discussed regular exercise and avoidance of concentrated sweets pending [facility].

## 2016-05-20 NOTE — Patient Instructions (Signed)
[facility] will call you with the time for classes. Start exercising 30 minutes daily with walking/treadmill.

## 2016-05-24 ENCOUNTER — Telehealth: Payer: Self-pay | Admitting: Family Medicine

## 2016-05-24 NOTE — Telephone Encounter (Signed)
Pt stated that when saw Mikki Santee on 05/20/16 he started her on metFORMIN (GLUCOPHAGE) 500 MG tablet. Pt stated that she started it 05/20/16 as directed and she noticed on Saturday she started feeling nauseous and thinks the medication is why. Pt would like to be advised if this is normal and should she try something else. Please advise. Thanks TNP

## 2016-05-24 NOTE — Telephone Encounter (Signed)
Patient was advised as below. KW 

## 2016-05-24 NOTE — Telephone Encounter (Signed)
Patient states that her nausea began Saturday 05/22/16, patient started Metformin on 05/20/16. Patient states that she has been taking medication after eating meals but nausea persist along with G.I upset. Patient would like to know if she should d/c medication? Pharmacy [facility] [place]. KW

## 2016-05-24 NOTE — Telephone Encounter (Signed)
Back down to one pill a day with your biggest meal. If still not tolerating (give it a few days) call me back. If you do tolerate this wait for a week then try going back to twice a day again.

## 2016-06-08 ENCOUNTER — Ambulatory Visit: Payer: BLUE CROSS/BLUE SHIELD | Admitting: *Deleted

## 2016-06-17 ENCOUNTER — Ambulatory Visit: Payer: BLUE CROSS/BLUE SHIELD | Admitting: Family Medicine

## 2016-06-25 ENCOUNTER — Telehealth: Payer: Self-pay | Admitting: Family Medicine

## 2016-06-25 ENCOUNTER — Other Ambulatory Visit: Payer: Self-pay | Admitting: Family Medicine

## 2016-06-25 DIAGNOSIS — J45901 Unspecified asthma with (acute) exacerbation: Secondary | ICD-10-CM

## 2016-06-25 MED ORDER — FLUTICASONE-SALMETEROL 115-21 MCG/ACT IN AERO: 2.0000 | INHALATION_SPRAY | Freq: Two times a day (BID) | RESPIRATORY_TRACT | 12 refills | Status: DC

## 2016-06-25 NOTE — Telephone Encounter (Signed)
Sent generic Advair to [facility] in [place] to see if it is cheaper.

## 2016-06-25 NOTE — Telephone Encounter (Signed)
Pt stated that she saw Simona Huh at [place] yesterday and he gave her an Rx for [facility] inhaler but it was to much out of pocket and she would like to know if she can get samples or call in something cheaper to [facility] [place] . Please advise. Thanks TNP

## 2016-06-25 NOTE — Telephone Encounter (Signed)
Please advise. Kaiyla Stahly Drozdowski, CMA  

## 2016-06-28 NOTE — Telephone Encounter (Signed)
Patient advised. Samples left at the front desk for pick up.

## 2016-06-28 NOTE — Telephone Encounter (Signed)
Pt returned call and stated the generic Advair wasn't any cheaper and asked again if we have any samples. Please advise. Thanks TNP

## 2016-06-28 NOTE — Telephone Encounter (Signed)
Give 2 samples of Advair 250/50  and use 1 inhalation BID.

## 2016-06-28 NOTE — Telephone Encounter (Signed)
Please advise. No samples available of 115-21 mcg.

## 2016-06-28 NOTE — Telephone Encounter (Signed)
LMTCB

## 2016-09-20 ENCOUNTER — Ambulatory Visit (INDEPENDENT_AMBULATORY_CARE_PROVIDER_SITE_OTHER): Payer: BLUE CROSS/BLUE SHIELD | Admitting: Physician Assistant

## 2016-09-20 ENCOUNTER — Encounter: Payer: Self-pay | Admitting: Physician Assistant

## 2016-09-20 VITALS — BP 102/80 | HR 112 | Temp 98.1°F | Resp 16 | Ht 63.0 in | Wt 245.0 lb

## 2016-09-20 DIAGNOSIS — E119 Type 2 diabetes mellitus without complications: Secondary | ICD-10-CM | POA: Diagnosis not present

## 2016-09-20 LAB — POCT URINALYSIS DIPSTICK
BILIRUBIN UA: NEGATIVE
GLUCOSE UA: 2000
Ketones, UA: 15
Leukocytes, UA: NEGATIVE
NITRITE UA: NEGATIVE
PH UA: 6
Protein, UA: NEGATIVE
RBC UA: NEGATIVE
UROBILINOGEN UA: 0.2

## 2016-09-20 LAB — POCT GLYCOSYLATED HEMOGLOBIN (HGB A1C)

## 2016-09-20 MED ORDER — INSULIN GLARGINE 100 UNIT/ML SOLOSTAR PEN: 22.0000 [IU] | PEN_INJECTOR | Freq: Every day | SUBCUTANEOUS | 1 refills | Status: DC

## 2016-09-20 NOTE — Progress Notes (Signed)
Patient: Tina Henderson Female    DOB: [birth year]   46 y.o.   MRN: [id number] Visit Date: 09/20/2016  Today's Provider: Mar Daring, PA-C   Chief Complaint  Patient presents with  . Abdominal Pain   Subjective:    HPI Patient c/o abdominal pain on and off times several weeks. Patient reports nausea, vomiting, diarrhea and abdominal pain. Patient reports symptoms have improved. Patient reports that she has started taking omeprazole daily and that has helped with symptoms. Patient concerned about diabetes due to increase fatigue, thirst and vision problems.  Patient reports she was started on Metformin 1 tablet BID. Patient reports she was not able to tolerate medication and was advised to take 1 tablet daily. Patient reports that she has not taken metformin since Christmas due to GI bug.    Diabetes Mellitus Type II, Follow-up:   Lab Results  Component Value Date   HGBA1C 7.2 (H) 05/14/2016   HGBA1C 6.2 (A) 11/04/2014    Last seen for diabetes 3 months ago.  Management since then includes starting metformin 500 mg BID. She reports poor compliance with treatment. She is not having side effects.  Current symptoms include polydipsia and visual disturbances and have been unchanged. Home blood sugar records: fasting range: not being checked  Episodes of hypoglycemia? no   Current Insulin Regimen: none Most Recent Eye Exam: UTD Weight trend: stable Prior visit with dietician: no Current diet: in general, a "healthy" diet   Current exercise: none  Pertinent Labs:    Component Value Date/Time   CHOL 161 05/14/2016 0817   TRIG 92 05/14/2016 0817   HDL 54 05/14/2016 0817   LDLCALC 89 05/14/2016 0817   CREATININE 0.60 05/14/2016 0817    Wt Readings from Last 3 Encounters:  09/20/16 245 lb (111.1 kg)  05/20/16 267 lb (121.1 kg)  05/13/16 266 lb 9.6 oz (120.9 kg)   ------------------------------------------------------------------------    No Known  Allergies   Current Outpatient Prescriptions:  .  citalopram (CELEXA) 20 MG tablet, Take 1.5 tablets (30 mg total) by mouth daily., Disp: 135 tablet, Rfl: 3 .  fluticasone (FLONASE) 50 MCG/ACT nasal spray, Place 2 sprays into both nostrils daily., Disp: 16 g, Rfl: 5 .  loratadine (CLARITIN) 10 MG tablet, Take 10 mg by mouth daily., Disp: , Rfl:  .  lovastatin (MEVACOR) 20 MG tablet, Take 1 tablet (20 mg total) by mouth daily., Disp: 90 tablet, Rfl: 3 .  triamterene-hydrochlorothiazide (MAXZIDE-25) 37.5-25 MG tablet, Take 1 tablet by mouth daily., Disp: 90 tablet, Rfl: 1 .  ALPRAZolam (XANAX) 0.5 MG tablet, Take 1 tablet (0.5 mg total) by mouth 3 (three) times daily as needed. (Patient not taking: Reported on 09/20/2016), Disp: 60 tablet, Rfl: 0 .  metFORMIN (GLUCOPHAGE) 500 MG tablet, Take 1 tablet (500 mg total) by mouth 2 (two) times daily with a meal. (Patient not taking: Reported on 09/20/2016), Disp: 60 tablet, Rfl: 1  Review of Systems  Constitutional: Positive for activity change, appetite change and fatigue.  Eyes: Negative for visual disturbance.  Respiratory: Negative.   Cardiovascular: Negative.   Gastrointestinal: Positive for abdominal pain.  Endocrine: Positive for polydipsia and polyuria. Negative for polyphagia.  Neurological: Negative for dizziness, seizures, syncope, weakness, numbness and headaches.    Social History  Substance Use Topics  . Smoking status: Never Smoker  . Smokeless tobacco: Never Used  . Alcohol use No   Objective:   BP 102/80 (BP Location: Left Arm, Patient  Position: Sitting, Cuff Size: Large)   Pulse (!) 112   Temp 98.1 F (36.7 C) (Oral)   Resp 16   Ht 5\' 3"  (1.6 m)   Wt 245 lb (111.1 kg)   BMI 43.40 kg/m   Physical Exam  Constitutional: She appears well-developed and well-nourished. No distress.  Neck: Normal range of motion. Neck supple. No JVD present. No tracheal deviation present. No thyromegaly present.  Cardiovascular: Normal rate,  regular rhythm and normal heart sounds.  Exam reveals no gallop and no friction rub.   No murmur heard. Pulmonary/Chest: Effort normal and breath sounds normal. No respiratory distress. She has no wheezes. She has no rales.  Lymphadenopathy:    She has no cervical adenopathy.  Skin: She is not diaphoretic.  Vitals reviewed.     Assessment & Plan:     1. Type 2 diabetes mellitus without complication, without long-term current use of insulin (HCC) A1c today in the office was greater than 14. Urinalysis had greater than 2000 and glucose. Symptoms are most likely secondary to uncontrolled diabetes. I will start patient on Lantus solo start pen to inject 22 units at night. I will follow-up with her in one week with the following interview and I will see her back in 4 weeks with a recheck of her hemoglobin A1c at that time. I will also check other labs as below and follow-up with her pending these results. - POCT glycosylated hemoglobin (Hb A1C) - POCT urinalysis dipstick - HgB A1c - CBC w/Diff/Platelet - Comprehensive Metabolic Panel (CMET) - Insulin Glargine (LANTUS SOLOSTAR) 100 UNIT/ML Solostar Pen; Inject 22 Units into the skin daily at 10 pm.  Dispense: 3 mL; Refill: [place], PA-C  [facility]

## 2016-09-20 NOTE — Patient Instructions (Signed)
How and Where to Give Subcutaneous Insulin Injections, Adult People with type 1 diabetes must take insulin since their bodies do not make it. People with type 2 diabetes may require insulin. There are many different types of insulin as well as other injectable diabetes medicines that are meant to be injected into the fat layer under your skin. The type of insulin or injectable diabetes medicine you take may determine how many injections you give yourself and when to take the injections. Choosing a site for injection Insulin absorption varies from site to site. As with any injectable medication it is best for the insulin to be injected within the same body region. However, do not inject the insulin in the same spot each time. Rotating the spots you give your injections will prevent inflammation or tissue breakdown. There are four main regions that can be used for injections. The regions include the:  Abdomen (preferred region, especially for non-insulin injectable diabetes medicine).  Front and upper outer sides of thighs.  Back of upper arm.  Buttocks. Using a syringe and vial Drawing up insulin: single insulin dose   1. Wash your hands with soap and water. 2. Gently roll the insulin bottle (vial) between your hands to mix it. Do not shake the vial. 3. Clean the top rubber part of the vial with an alcohol wipe. Be sure that the plastic pop-top has been removed on newer vials. 4. Remove the plastic cover from the needle on the syringe. Do not let the needle touch anything. 5. Pull the plunger back to draw air into the syringe. The air should be the same amount as the insulin dose. 6. Push the needle through the rubber on the top of the vial. Do not turn the vial over. 7. Push the plunger in all the way to put the air into the vial. 8. Leave the needle in the vial and turn the vial and syringe upside down. 9. Pull down slowly on the plunger, drawing the amount of insulin you need into the  syringe. 10. Look for air bubbles in the syringe. You may need to push the plunger up and down 2 to 3 times to slowly get rid of any air bubbles in the syringe. 11. Pull back the plunger to get your correct dose. 12. Remove the needle from the vial. 13. Use an alcohol wipe to clean the area of the body to be injected. 14. Pinch up 1 inch of skin and hold it. 15. Put the needle straight into the skin (90-degree angle). Put the needle in as far as it will go (to the hub). The needle may need to be injected at a 45-degree angle in small adults with little fat. 16. When the needle is in, you can let go of your skin. 17. Push the plunger down all the way to inject the insulin. 18. Pull the needle straight out of the skin. 19. Press the alcohol wipe over the spot where you gave your injection. Keep it there for a few seconds. Do not rub the area. 20. Do not put the plastic cover back on the needle. Drawing up insulin: mixing 2 insulins  1. Wash your hands with soap and water. 2. Gently roll the vial of "cloudy" insulin between your hands or rotate the vial from top to bottom to mix. 3. Clean the top of both vials with an alcohol wipe. Be sure that the plastic pop-top lid has been removed on newer vials. 4. Pull air into the   syringe to equal the dose of "cloudy" insulin. 5. Stick the needle into the "cloudy" insulin vial and inject the air. Be sure to keep the vial upright. 6. Remove the needle from the "cloudy" insulin vial. 7. Pull air into the syringe to equal the dose of "clear" insulin. 8. Stick the needle into the "clear" insulin vial and inject the air. 9. Leave the needle in the "clear" insulin vial and turn the vial upside down. 10. Pull down on the plunger and slowly draw into the syringe the number of units of "clear" insulin desired. 11. Look for air bubbles in the syringe. You may need to push the plunger up and down 2 to 3 times to slowly get rid of any air bubbles in the  syringe. 12. Remove the needle from the "clear" insulin vial. 13. Stick the needle into the "cloudy" insulin vial. Do not inject any of the "clear" insulin into the "cloudy" vial. 14. Turn the "cloudy" vial upside down and pull the plunger down to the number of units that equals the total number of units of "clear" and "cloudy" insulins. 15. Remove the needle from the "cloudy" insulin vial. 16. Use an alcohol wipe to clean the area of the body to be injected. 17. Put the needle straight into the skin (90-degree angle). Put the needle in as far as it will go (to the hub). The needle may need to be injected at a 45-degree angle in small adults with little fat. 18. When the needle is in, you can let go of your skin. 19. Push the plunger down all the way to inject the insulin. 20. Pull the needle straight out of the skin. 21. Press the alcohol wipe over the spot where you gave your injection. Keep it there for a few seconds. Do not rub the area. 22. Do not put the plastic cover back on the needle. Using an insulin pen  1. Wash your hands with soap and water. 2. If you are using the "cloudy" insulin, roll the pen between your palms several times or rotate the pen top to bottom several times. 3. Remove the insulin pen cap. 4. Clean the rubber stopper of the cartridge with an alcohol wipe. 5. Remove the protective paper tab from the disposable needle. 6. Screw the needle onto the pen. 7. Remove the outer plastic needle cover. 8. Remove the inner plastic needle cover. 9. Prime the insulin pen by turning the button (dial) to 2 units. Hold the pen with the needle pointing up, and push the dial on the opposite end until a drop of insulin appears at the needle tip. If no insulin appears, repeat this step. 10. Dial the number of units of insulin you will inject. 11. Use an alcohol wipe to clean the area of the body to be injected. 12. Pinch up 1 inch of skin and hold it. 13. Put the needle straight into  the skin (90-degree angle). 14. Push the dial down to push the insulin into the fat tissue. 15. Count to 10 slowly. Then, remove the needle from the fat tissue. 16. Carefully replace the larger outer plastic needle cover over the needle and unscrew the capped needle. Throwing away supplies  Discard used needles in a puncture proof sharps disposal container. Follow disposal regulations for the area where you live.  Vials and empty disposable pens may be thrown away in the regular trash. This information is not intended to replace advice given to you by your health care   provider. Make sure you discuss any questions you have with your health care provider. Document Released: 11/20/2003 Document Revised: 02/05/2016 Document Reviewed: 02/06/2013 Elsevier Interactive Patient Education  2017 [employer]  

## 2016-09-22 ENCOUNTER — Telehealth: Payer: Self-pay | Admitting: Physician Assistant

## 2016-09-22 ENCOUNTER — Encounter: Payer: Self-pay | Admitting: Emergency Medicine

## 2016-09-22 ENCOUNTER — Inpatient Hospital Stay
Admission: EM | Admit: 2016-09-22 | Discharge: 2016-09-23 | DRG: 638 | Disposition: A | Discharge status: Home / Self Care | Payer: BLUE CROSS/BLUE SHIELD | Attending: Internal Medicine | Admitting: Internal Medicine

## 2016-09-22 ENCOUNTER — Ambulatory Visit: Payer: Self-pay | Admitting: Physician Assistant

## 2016-09-22 DIAGNOSIS — Z79899 Other long term (current) drug therapy: Secondary | ICD-10-CM

## 2016-09-22 DIAGNOSIS — E785 Hyperlipidemia, unspecified: Secondary | ICD-10-CM | POA: Diagnosis present

## 2016-09-22 DIAGNOSIS — E7800 Pure hypercholesterolemia, unspecified: Secondary | ICD-10-CM

## 2016-09-22 DIAGNOSIS — E1310 Other specified diabetes mellitus with ketoacidosis without coma: Secondary | ICD-10-CM

## 2016-09-22 DIAGNOSIS — E871 Hypo-osmolality and hyponatremia: Secondary | ICD-10-CM | POA: Diagnosis present

## 2016-09-22 DIAGNOSIS — Z6841 Body Mass Index (BMI) 40.0 and over, adult: Secondary | ICD-10-CM | POA: Diagnosis not present

## 2016-09-22 DIAGNOSIS — N179 Acute kidney failure, unspecified: Secondary | ICD-10-CM | POA: Diagnosis present

## 2016-09-22 DIAGNOSIS — D751 Secondary polycythemia: Secondary | ICD-10-CM | POA: Diagnosis present

## 2016-09-22 DIAGNOSIS — N289 Disorder of kidney and ureter, unspecified: Secondary | ICD-10-CM

## 2016-09-22 DIAGNOSIS — E1110 Type 2 diabetes mellitus with ketoacidosis without coma: Secondary | ICD-10-CM | POA: Diagnosis not present

## 2016-09-22 DIAGNOSIS — Z794 Long term (current) use of insulin: Secondary | ICD-10-CM | POA: Diagnosis not present

## 2016-09-22 DIAGNOSIS — E860 Dehydration: Secondary | ICD-10-CM | POA: Diagnosis present

## 2016-09-22 LAB — BASIC METABOLIC PANEL
ANION GAP: 7 (ref 5–15)
Anion gap: 16 — ABNORMAL HIGH (ref 5–15)
BUN: 15 mg/dL (ref 6–20)
BUN: 15 mg/dL (ref 6–20)
CALCIUM: 8.3 mg/dL — AB (ref 8.9–10.3)
CHLORIDE: 101 mmol/L (ref 101–111)
CO2: 12 mmol/L — AB (ref 22–32)
CO2: 18 mmol/L — AB (ref 22–32)
CREATININE: 0.86 mg/dL (ref 0.44–1.00)
Calcium: 9 mg/dL (ref 8.9–10.3)
Chloride: 110 mmol/L (ref 101–111)
Creatinine, Ser: 0.56 mg/dL (ref 0.44–1.00)
GFR calc non Af Amer: >60 mL/min (ref >60)
Glucose, Bld: 375 mg/dL — ABNORMAL HIGH (ref 65–99)
Glucose, Bld: 162 mg/dL — ABNORMAL HIGH (ref 65–99)
POTASSIUM: 4.4 mmol/L (ref 3.5–5.1)
POTASSIUM: 3.7 mmol/L (ref 3.5–5.1)
Sodium: 129 mmol/L — ABNORMAL LOW (ref 135–145)
Sodium: 135 mmol/L (ref 135–145)

## 2016-09-22 LAB — URINALYSIS, COMPLETE (UACMP) WITH MICROSCOPIC
BACTERIA UA: NONE SEEN
BILIRUBIN URINE: NEGATIVE
Glucose, UA: >=500 mg/dL — AB
KETONES UR: 80 mg/dL — AB
Leukocytes, UA: NEGATIVE
Nitrite: NEGATIVE
PH: 5 (ref 5.0–8.0)
PROTEIN: 100 mg/dL — AB
Specific Gravity, Urine: 1.036 — ABNORMAL HIGH (ref 1.005–1.030)

## 2016-09-22 LAB — MRSA PCR SCREENING: MRSA by PCR: NEGATIVE

## 2016-09-22 LAB — CBC WITH DIFFERENTIAL/PLATELET
BASOS ABS: 0 10*3/uL (ref 0.0–0.2)
Basos: 0 %
EOS (ABSOLUTE): 0.1 10*3/uL (ref 0.0–0.4)
EOS: 1 %
HEMATOCRIT: 49 % — AB (ref 34.0–46.6)
Hemoglobin: 15.7 g/dL (ref 11.1–15.9)
Immature Grans (Abs): 0 10*3/uL (ref 0.0–0.1)
Immature Granulocytes: 0 %
LYMPHS ABS: 3.4 10*3/uL — AB (ref 0.7–3.1)
Lymphs: 32 %
MCH: 29 pg (ref 26.6–33.0)
MCHC: 32 g/dL (ref 31.5–35.7)
MCV: 91 fL (ref 79–97)
MONOS ABS: 1 10*3/uL — AB (ref 0.1–0.9)
Monocytes: 10 %
Neutrophils Absolute: 6.1 10*3/uL (ref 1.4–7.0)
Neutrophils: 57 %
Platelets: 318 10*3/uL (ref 150–379)
RBC: 5.41 x10E6/uL — AB (ref 3.77–5.28)
RDW: 13.2 % (ref 12.3–15.4)
WBC: 10.7 10*3/uL (ref 3.4–10.8)

## 2016-09-22 LAB — COMPREHENSIVE METABOLIC PANEL
ALT: 39 IU/L — ABNORMAL HIGH (ref 0–32)
AST: 26 IU/L (ref 0–40)
Albumin/Globulin Ratio: 1.6 (ref 1.2–2.2)
Albumin: 4.6 g/dL (ref 3.5–5.5)
Alkaline Phosphatase: 158 IU/L — ABNORMAL HIGH (ref 39–117)
BUN/Creatinine Ratio: 15 (ref 9–23)
BUN: 17 mg/dL (ref 6–24)
Bilirubin Total: 0.5 mg/dL (ref 0.0–1.2)
CO2: 23 mmol/L (ref 18–29)
CREATININE: 1.14 mg/dL — AB (ref 0.57–1.00)
Calcium: 10.1 mg/dL (ref 8.7–10.2)
Chloride: 80 mmol/L — ABNORMAL LOW (ref 96–106)
GFR calc Af Amer: 67 mL/min/{1.73_m2} (ref >59)
GFR calc non Af Amer: 58 mL/min/{1.73_m2} — ABNORMAL LOW (ref >59)
GLOBULIN, TOTAL: 2.8 g/dL (ref 1.5–4.5)
Glucose: 750 mg/dL (ref 65–99)
POTASSIUM: 5.1 mmol/L (ref 3.5–5.2)
SODIUM: 126 mmol/L — AB (ref 134–144)
Total Protein: 7.4 g/dL (ref 6.0–8.5)

## 2016-09-22 LAB — CBC
HEMATOCRIT: 47.4 % — AB (ref 35.0–47.0)
Hemoglobin: 15.8 g/dL (ref 12.0–16.0)
MCH: 28.3 pg (ref 26.0–34.0)
MCHC: 33.4 g/dL (ref 32.0–36.0)
MCV: 84.7 fL (ref 80.0–100.0)
PLATELETS: 274 10*3/uL (ref 150–440)
RBC: 5.6 MIL/uL — AB (ref 3.80–5.20)
RDW: 13.6 % (ref 11.5–14.5)
WBC: 10.4 10*3/uL (ref 3.6–11.0)

## 2016-09-22 LAB — GLUCOSE, CAPILLARY
GLUCOSE-CAPILLARY: 345 mg/dL — AB (ref 65–99)
GLUCOSE-CAPILLARY: 231 mg/dL — AB (ref 65–99)
GLUCOSE-CAPILLARY: 192 mg/dL — AB (ref 65–99)
GLUCOSE-CAPILLARY: 196 mg/dL — AB (ref 65–99)
Glucose-Capillary: 223 mg/dL — ABNORMAL HIGH (ref 65–99)
Glucose-Capillary: 237 mg/dL — ABNORMAL HIGH (ref 65–99)
Glucose-Capillary: 237 mg/dL — ABNORMAL HIGH (ref 65–99)
Glucose-Capillary: 271 mg/dL — ABNORMAL HIGH (ref 65–99)
Glucose-Capillary: 141 mg/dL — ABNORMAL HIGH (ref 65–99)

## 2016-09-22 LAB — HEMOGLOBIN A1C
ESTIMATED AVERAGE GLUCOSE: 338 mg/dL
Hgb A1c MFr Bld: 13.4 % — ABNORMAL HIGH (ref 4.8–5.6)

## 2016-09-22 MED ORDER — LORATADINE 10 MG PO TABS
10.0000 mg | ORAL_TABLET | Freq: Every day | ORAL | Status: DC
  Administered 2016-09-23: 10 mg via ORAL
  Filled 2016-09-22: qty 1

## 2016-09-22 MED ORDER — ONDANSETRON HCL 4 MG/2ML IJ SOLN
4.0000 mg | Freq: Once | INTRAMUSCULAR | Status: AC
  Administered 2016-09-22: 4 mg via INTRAVENOUS

## 2016-09-22 MED ORDER — SODIUM CHLORIDE 0.9 % IV SOLN
INTRAVENOUS | Status: DC
  Administered 2016-09-22: 1.6 [IU]/h via INTRAVENOUS
  Administered 2016-09-23: 2.8 [IU]/h via INTRAVENOUS
  Administered 2016-09-23: 3.2 [IU]/h via INTRAVENOUS
  Filled 2016-09-22: qty 2.5

## 2016-09-22 MED ORDER — DOCUSATE SODIUM 100 MG PO CAPS
100.0000 mg | ORAL_CAPSULE | Freq: Two times a day (BID) | ORAL | Status: DC
  Administered 2016-09-23: 100 mg via ORAL
  Filled 2016-09-22: qty 1

## 2016-09-22 MED ORDER — SODIUM CHLORIDE 0.9% FLUSH
3.0000 mL | Freq: Two times a day (BID) | INTRAVENOUS | Status: DC
  Administered 2016-09-23: 3 mL via INTRAVENOUS

## 2016-09-22 MED ORDER — DEXTROSE-NACL 5-0.45 % IV SOLN
INTRAVENOUS | Status: DC
  Administered 2016-09-22: 1000 mL via INTRAVENOUS
  Administered 2016-09-23 (×2): via INTRAVENOUS

## 2016-09-22 MED ORDER — SODIUM CHLORIDE 0.9 % IV SOLN
INTRAVENOUS | Status: DC
  Administered 2016-09-22: 20:00:00 via INTRAVENOUS

## 2016-09-22 MED ORDER — ONDANSETRON HCL 4 MG/2ML IJ SOLN
INTRAMUSCULAR | Status: AC
  Administered 2016-09-22: 4 mg via INTRAVENOUS
  Filled 2016-09-22: qty 2

## 2016-09-22 MED ORDER — FLUTICASONE PROPIONATE 50 MCG/ACT NA SUSP
2.0000 | Freq: Every day | NASAL | Status: DC
  Administered 2016-09-23: 2 via NASAL
  Filled 2016-09-22: qty 16

## 2016-09-22 MED ORDER — ONDANSETRON HCL 4 MG PO TABS
4.0000 mg | ORAL_TABLET | Freq: Four times a day (QID) | ORAL | Status: DC | PRN
  Filled 2016-09-22: qty 1

## 2016-09-22 MED ORDER — SODIUM CHLORIDE 0.9 % IV BOLUS (SEPSIS)
1000.0000 mL | Freq: Once | INTRAVENOUS | Status: AC
  Administered 2016-09-22: 1000 mL via INTRAVENOUS

## 2016-09-22 MED ORDER — ONDANSETRON HCL 4 MG/2ML IJ SOLN: 4.0000 mg | Freq: Four times a day (QID) | INTRAMUSCULAR | Status: DC | PRN

## 2016-09-22 MED ORDER — ALPRAZOLAM 0.5 MG PO TABS
0.5000 mg | ORAL_TABLET | Freq: Three times a day (TID) | ORAL | Status: DC | PRN
  Administered 2016-09-22: 0.5 mg via ORAL
  Filled 2016-09-22: qty 1

## 2016-09-22 MED ORDER — ENOXAPARIN SODIUM 40 MG/0.4ML ~~LOC~~ SOLN
40.0000 mg | SUBCUTANEOUS | Status: DC
  Administered 2016-09-22: 40 mg via SUBCUTANEOUS
  Filled 2016-09-22 (×2): qty 0.4

## 2016-09-22 MED ORDER — PRAVASTATIN SODIUM 20 MG PO TABS
20.0000 mg | ORAL_TABLET | Freq: Every day | ORAL | Status: DC
  Administered 2016-09-22: 20 mg via ORAL
  Filled 2016-09-22: qty 1

## 2016-09-22 MED ORDER — CITALOPRAM HYDROBROMIDE 20 MG PO TABS
30.0000 mg | ORAL_TABLET | Freq: Every day | ORAL | Status: DC
  Administered 2016-09-22: 30 mg via ORAL
  Filled 2016-09-22 (×2): qty 1
  Filled 2016-09-22: qty 2

## 2016-09-22 NOTE — H&P (Addendum)
[facility] NAME: Tina Henderson    MR#:  [id number]  DATE OF BIRTH:  1971/01/19  DATE OF ADMISSION:  09/22/2016  PRIMARY CARE PHYSICIAN: Mar Daring, PA-C   REQUESTING/REFERRING PHYSICIAN:   CHIEF COMPLAINT:   Chief Complaint  Patient presents with  . Dizziness    HISTORY OF PRESENT ILLNESS: Tina Henderson  is a 46 y.o. female with a known history of Hypertension, hyperlipidemia, anxiety, depression, asthma, diabetes mellitus type 2, obesity, who presents to the hospital with complaints of feeling well for the past few weeks, losing weight. Because of her fatigue and weakness, increased frequency of urination, dizziness, dry heaving, dry mouth, excessive drinking, nausea, discomfort in abdomen, patient was seen by her primary care physician on Monday. Few days ago, and was initiated on insulin Lantus injections, however, did not feel well and presented to emergency room for further evaluation and therapy. In the emergency room she was noted to have hyperglycemia as well as acidosis and the hospitalist services were contacted for admission for DKA. Patient was initiated on insulin IV drip.  PAST MEDICAL HISTORY:  History reviewed. No pertinent past medical history.  PAST SURGICAL HISTORY: Past Surgical History:  Procedure Laterality Date  . ABDOMINAL HYSTERECTOMY  01/04/2011   Due to Endometriosis  . APPENDECTOMY  2002  . LAPAROSCOPIC OVARIAN      SOCIAL HISTORY:  Social History  Substance Use Topics  . Smoking status: Never Smoker  . Smokeless tobacco: Never Used  . Alcohol use No    FAMILY HISTORY:  Family History  Problem Relation Age of Onset  . COPD Mother   . Lung cancer Father   . Bone cancer Father     DRUG ALLERGIES: No Known Allergies  Review of Systems  Constitutional: Positive for malaise/fatigue and weight loss. Negative for chills and fever.  HENT: Negative for congestion.   Eyes: Positive  for blurred vision. Negative for double vision.  Respiratory: Negative for cough, sputum production, shortness of breath and wheezing.   Cardiovascular: Negative for chest pain, palpitations, orthopnea, leg swelling and PND.  Gastrointestinal: Positive for abdominal pain, nausea and vomiting. Negative for blood in stool, constipation and diarrhea.  Genitourinary: Positive for frequency. Negative for dysuria, hematuria and urgency.  Musculoskeletal: Positive for myalgias. Negative for falls.  Neurological: Positive for dizziness. Negative for tremors, focal weakness and headaches.  Endo/Heme/Allergies: Does not bruise/bleed easily.  Psychiatric/Behavioral: Negative for depression. The patient does not have insomnia.     MEDICATIONS AT HOME:  Prior to Admission medications   Medication Sig Start Date End Date Taking? Authorizing Provider  ALPRAZolam Duanne Moron) 0.5 MG tablet Take 1 tablet (0.5 mg total) by mouth 3 (three) times daily as needed. Patient not taking: Reported on 09/20/2016 05/13/15   Margarita Rana, MD  citalopram (CELEXA) 20 MG tablet Take 1.5 tablets (30 mg total) by mouth daily. 11/14/15   Margarita Rana, MD  fluticasone (FLONASE) 50 MCG/ACT nasal spray Place 2 sprays into both nostrils daily. 11/14/15   Margarita Rana, MD  Insulin Glargine (LANTUS SOLOSTAR) 100 UNIT/ML Solostar Pen Inject 22 Units into the skin daily at 10 pm. 09/20/16   Mar Daring, PA-C  loratadine (CLARITIN) 10 MG tablet Take 10 mg by mouth daily.    Historical Provider, MD  lovastatin (MEVACOR) 20 MG tablet Take 1 tablet (20 mg total) by mouth daily. 11/14/15   Margarita Rana, MD  metFORMIN (GLUCOPHAGE) 500 MG tablet Take 1  tablet (500 mg total) by mouth 2 (two) times daily with a meal. Patient not taking: Reported on 09/20/2016 05/20/16   Carmon Ginsberg, PA  triamterene-hydrochlorothiazide (MAXZIDE-25) 37.5-25 MG tablet Take 1 tablet by mouth daily. 11/14/15   Margarita Rana, MD      PHYSICAL EXAMINATION:   VITAL  SIGNS: Blood pressure 106/73, pulse 99, temperature 98.4 F (36.9 C), temperature source Oral, resp. rate 16, weight 111.1 kg (245 lb), SpO2 95 %.  GENERAL:  46 y.o.-year-old [place], obese patient lying in the bed with no acute distress.  EYES: Pupils equal, round, reactive to light and accommodation. No scleral icterus. Extraocular muscles intact.  HEENT: Head atraumatic, normocephalic. Oropharynx and nasopharynx clear.  NECK:  Supple, no jugular venous distention. No thyroid enlargement, no tenderness.  LUNGS: Some diminished breath sounds bilaterally, no wheezing, rales,rhonchi or crepitation. No use of accessory muscles of respiration.  CARDIOVASCULAR: S1, S2 normal. No murmurs, rubs, or gallops.  ABDOMEN: Soft, nontender, nondistended. Bowel sounds present. No organomegaly or mass.  EXTREMITIES: No pedal edema, cyanosis, or clubbing.  NEUROLOGIC: Cranial nerves II through XII are intact. Muscle strength 5/5 in all extremities. Sensation intact. Gait not checked.  PSYCHIATRIC: The patient is alert and oriented x 3.  SKIN: No obvious rash, lesion, or ulcer.   LABORATORY PANEL:   CBC  Recent Labs Lab 09/20/16 1655 09/22/16 1018  WBC 10.7 10.4  HGB  --  15.8  HCT 49.0* 47.4*  PLT 318 274  MCV 91 84.7  MCH 29.0 28.3  MCHC 32.0 33.4  RDW 13.2 13.6  LYMPHSABS 3.4*  --   EOSABS 0.1  --   BASOSABS 0.0  --    ------------------------------------------------------------------------------------------------------------------  Chemistries   Recent Labs Lab 09/20/16 1655 09/22/16 1018  NA 126* 129*  K 5.1 4.4  CL 80* 101  CO2 23 12*  GLUCOSE 750* 375*  BUN 17 15  CREATININE 1.14* 0.86  CALCIUM 10.1 9.0  AST 26  --   ALT 39*  --   ALKPHOS 158*  --   BILITOT 0.5  --    ------------------------------------------------------------------------------------------------------------------  Cardiac Enzymes No results for input(s): TROPONINI in the last 168  hours. ------------------------------------------------------------------------------------------------------------------  RADIOLOGY: No results found.  EKG: Orders placed or performed in visit on 11/01/14  . EKG 12-Lead    IMPRESSION AND PLAN:  Principal Problem:   DKA (diabetic ketoacidosis) ([place]) Active Problems:   Hyponatremia   Acute renal insufficiency   Dehydration   Polycythemia   DKA (diabetic ketoacidoses) ([place])  #1. DKA, hemoglobin A1c 13.4, admit patient to medical floor, initiate her on insulin IV drip, IV fluids, following blood glucose levels frequently, get hemoglobin A1c, keep her nothing by mouth #2. Dehydration with hemoconcentration, continue IV fluids and hydrate, reassess BMP/CBC in the morning #3. Acute renal insufficiency noted, few days ago, resolved, urinalysis was unremarkable, follow closely #4. Hyponatremia, follow with IV fluid administration #5. Obesity, get TSH, lipid panel  All the records are reviewed and case discussed with ED provider. Management plans discussed with the patient, family and they are in agreement.  CODE STATUS: Code Status History    This patient does not have a recorded code status. Please follow your organizational policy for patients in this situation.       TOTAL TIME TAKING CARE OF THIS PATIENT: 50 minutes.    Theodoro Grist M.D on 09/22/2016 at 4:23 PM  Between 7am to 6pm - Pager - [phone] After 6pm go to [email or web address] - [place]  [facility]  Office  [phone]  CC: Primary care physician; Mar Daring, PA-C

## 2016-09-22 NOTE — ED Notes (Signed)
Report to Michelle, RN

## 2016-09-22 NOTE — Telephone Encounter (Signed)
-----   Message from Mar Daring, [place] sent at 09/22/2016  8:33 AM EST ----- A1c read as 13.4 and sugar was 750. Please see if patient is tolerating insulin and see if she will schedule to see me on Friday or Saturday if she cant come Friday since I work this Saturday. Thanks.

## 2016-09-22 NOTE — ED Triage Notes (Signed)
Pt to ed with c/o dizziness, weakness, and high blood sugars.  Per family pt has been "out of it" .  Pt states she was dx with diabetes 2 days ago and started on insulin, lantus 22 units at night.  Pt states she has continued to feel bad.  Skin warm and dry,  Pt reports increased urination, irritability and increased thirst.

## 2016-09-22 NOTE — ED Provider Notes (Signed)
[facility] Emergency Department Provider Note  ____________________________________________   None    (approximate)  I have reviewed the triage vital signs and the nursing notes.   HISTORY  Chief Complaint Dizziness   HPI AMILLIANNA REULE is a 46 y.o. female with a history of type 2 diabetes who was recently started on Lantus this past Monday who is presenting with dizziness as well as abdominal cramping, lower extremity cramping as well as nausea. She denies any vomiting. On Monday her A1c level was found to be 14. She had a glucose of greater than 750 but was not in DKA at that time. She was then started on 22 units of Lantus daily at bedtime. The patient has been feeling progressively worse over the past 2 days and presented to the emergency department today.   History reviewed. No pertinent past medical history.  Patient Active Problem List   Diagnosis Date Noted  . Type 2 diabetes mellitus without complication, without long-term current use of insulin ([place]) 05/20/2016  . Adaptation reaction 12/14/2014  . Anxiety disorder 12/14/2014  . Airway hyperreactivity 12/14/2014  . Body mass index of 60 or higher 12/14/2014  . History of chicken pox 12/14/2014  . Clinical depression 12/14/2014  . 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 Acid reflux 12/14/2014  . Hypercholesteremia 12/14/2014  . Cannot sleep 12/14/2014  . Burning or prickling sensation 12/14/2014  . Bilateral polycystic ovarian syndrome 12/14/2014  . Restless leg 12/14/2014  . Irregular bleeding 04/02/2010  . Auditory tube disorder 10/07/2009  . Avitaminosis D 01/30/2009  . Allergic rhinitis 01/11/2008  . Essential (primary) hypertension 01/11/2008    Past Surgical History:  Procedure Laterality Date  . ABDOMINAL HYSTERECTOMY  01/04/2011   Due to Endometriosis  . APPENDECTOMY  2002  . LAPAROSCOPIC OVARIAN      Prior to Admission medications   Medication Sig Start  Date End Date Taking? Authorizing Provider  ALPRAZolam Duanne Moron) 0.5 MG tablet Take 1 tablet (0.5 mg total) by mouth 3 (three) times daily as needed. Patient not taking: Reported on 09/20/2016 05/13/15   Margarita Rana, MD  citalopram (CELEXA) 20 MG tablet Take 1.5 tablets (30 mg total) by mouth daily. 11/14/15   Margarita Rana, MD  fluticasone (FLONASE) 50 MCG/ACT nasal spray Place 2 sprays into both nostrils daily. 11/14/15   Margarita Rana, MD  Insulin Glargine (LANTUS SOLOSTAR) 100 UNIT/ML Solostar Pen Inject 22 Units into the skin daily at 10 pm. 09/20/16   Mar Daring, PA-C  loratadine (CLARITIN) 10 MG tablet Take 10 mg by mouth daily.    Historical Provider, MD  lovastatin (MEVACOR) 20 MG tablet Take 1 tablet (20 mg total) by mouth daily. 11/14/15   Margarita Rana, MD  metFORMIN (GLUCOPHAGE) 500 MG tablet Take 1 tablet (500 mg total) by mouth 2 (two) times daily with a meal. Patient not taking: Reported on 09/20/2016 05/20/16   Carmon Ginsberg, PA  triamterene-hydrochlorothiazide (MAXZIDE-25) 37.5-25 MG tablet Take 1 tablet by mouth daily. 11/14/15   Margarita Rana, MD    Allergies Patient has no known allergies.  Family History  Problem Relation Age of Onset  . COPD Mother   . Lung cancer Father   . Bone cancer Father     Social History Social History  Substance Use Topics  . Smoking status: Never Smoker  . Smokeless tobacco: Never Used  . Alcohol use No    Review of Systems Constitutional: No fever/chills Eyes: No visual changes. ENT: No sore throat.  Cardiovascular: Denies chest pain. Respiratory: Denies shortness of breath. Gastrointestinal: no vomiting.  No diarrhea.  No constipation. Genitourinary: Negative for dysuria. Musculoskeletal: Negative for back pain. Skin: Negative for rash. Neurological: Negative for headaches, focal weakness or numbness.  10-point ROS otherwise negative.  ____________________________________________   PHYSICAL EXAM:  VITAL SIGNS: ED Triage  Vitals [09/22/16 1015]  Enc Vitals Group     BP 134/73     Pulse Rate (!) 110     Resp 20     Temp 97.8 F (36.6 C)     Temp Source Oral     SpO2 97 %     Weight 245 lb (111.1 kg)     Height      Head Circumference      Peak Flow      Pain Score 0     Pain Loc      Pain Edu?      Excl. in [place]?     Constitutional: Alert and oriented. Well appearing and in no acute distress. Eyes: Conjunctivae are normal. PERRL. EOMI. Head: Atraumatic. Nose: No congestion/rhinnorhea. Mouth/Throat: Mucous membranes are moist.   Neck: No stridor.   Cardiovascular: Normal rate, regular rhythm. Grossly normal heart sounds.   Respiratory: Normal respiratory effort.  No retractions. Lungs CTAB. Gastrointestinal: Soft and nontender. No distention.  Musculoskeletal: No lower extremity tenderness nor edema.  No joint effusions. Neurologic:  Normal speech and language. No gross focal neurologic deficits are appreciated. Skin:  Skin is warm, dry and intact. No rash noted. Psychiatric: Mood and affect are normal. Speech and behavior are normal.  ____________________________________________   LABS (all labs ordered are listed, but only abnormal results are displayed)  Labs Reviewed  BASIC METABOLIC PANEL - Abnormal; Notable for the following:       Result Value   Sodium 129 (*)    CO2 12 (*)    Glucose, Bld 375 (*)    Anion gap 16 (*)    All other components within normal limits  CBC - Abnormal; Notable for the following:    RBC 5.60 (*)    HCT 47.4 (*)    All other components within normal limits  BLOOD GAS, ARTERIAL - Abnormal; Notable for the following:    pH, Arterial 7.34 (*)    pCO2 arterial 19 (*)    pO2, Arterial 183 (*)    All other components within normal limits  URINALYSIS, COMPLETE (UACMP) WITH MICROSCOPIC  CBG MONITORING, ED    ____________________________________________  EKG   ____________________________________________  RADIOLOGY   ____________________________________________   PROCEDURES  Procedure(s) performed:   CRITICAL CARE Performed by: Doran Stabler   Total critical care time: 35 minutes  Critical care time was exclusive of separately billable procedures and treating other patients.  Critical care was necessary to treat or prevent imminent or life-threatening deterioration.  Critical care was time spent personally by me on the following activities: development of treatment plan with patient and/or surrogate as well as nursing, discussions with consultants, evaluation of patient's response to treatment, examination of patient, obtaining history from patient or surrogate, ordering and performing treatments and interventions, ordering and review of laboratory studies, ordering and review of radiographic studies, pulse oximetry and re-evaluation of patient's condition.   Procedures  Critical Care performed:   ____________________________________________   INITIAL IMPRESSION / ASSESSMENT AND PLAN / ED COURSE  Pertinent labs & imaging results that were available during my care of the patient were reviewed by me and considered in  my medical decision making (see chart for details).   Clinical Course    ----------------------------------------- 3:49 PM on 09/22/2016 -----------------------------------------  Patient mildly acidotic on the ABG. Anion gap of 16 with a bicarbonate of 12. Labs consistent with DKA. This is also consistent with the clinical picture. Patient will be admitted and placed on an insulin drip. An additional liter of IV normal saline was also ordered. Signed out to Dr. Leslye Peer.   Patient understands the diagnosis as well as the need for admission to the hospital.  ____________________________________________   FINAL CLINICAL IMPRESSION(S) / ED  DIAGNOSES  DKA    NEW MEDICATIONS STARTED DURING THIS VISIT:  New Prescriptions   No medications on file     Note:  This document was prepared using Dragon voice recognition software and may include unintentional dictation errors.    Orbie Pyo, MD 09/22/16 [phone]

## 2016-09-22 NOTE — ED Notes (Signed)
Patient in [place].  Given warm blankets.  Family at side.

## 2016-09-22 NOTE — Telephone Encounter (Signed)
Pt called saying she has been feeling nausea and dizziness since she has been taking the insulin in injections. Not feeling well at all.   Pt call back [phone]  Thanks Con Memos

## 2016-09-22 NOTE — Telephone Encounter (Signed)
Patient was advised of results. She reports that she is not feeling well. She is feeling dizzy, nauseas,  just "not good". Per Tawanna Sat patient needs to be seen today. Jenni's next available appointment is at 4 pm. Patient was informed and was also advised that if she feels she cannot wait until 4 pm and is feeling really bad to go the ER per provider since her blood sugar was 750. Per patient will wait until 4 pm to come.  Thanks,  -Joseline

## 2016-09-23 LAB — GLUCOSE, CAPILLARY
GLUCOSE-CAPILLARY: 132 mg/dL — AB (ref 65–99)
GLUCOSE-CAPILLARY: 137 mg/dL — AB (ref 65–99)
GLUCOSE-CAPILLARY: 171 mg/dL — AB (ref 65–99)
GLUCOSE-CAPILLARY: 173 mg/dL — AB (ref 65–99)
GLUCOSE-CAPILLARY: 179 mg/dL — AB (ref 65–99)
GLUCOSE-CAPILLARY: 195 mg/dL — AB (ref 65–99)
GLUCOSE-CAPILLARY: 215 mg/dL — AB (ref 65–99)
GLUCOSE-CAPILLARY: 225 mg/dL — AB (ref 65–99)
GLUCOSE-CAPILLARY: 238 mg/dL — AB (ref 65–99)
GLUCOSE-CAPILLARY: 243 mg/dL — AB (ref 65–99)
GLUCOSE-CAPILLARY: 302 mg/dL — AB (ref 65–99)
Glucose-Capillary: 133 mg/dL — ABNORMAL HIGH (ref 65–99)
Glucose-Capillary: 185 mg/dL — ABNORMAL HIGH (ref 65–99)
Glucose-Capillary: 177 mg/dL — ABNORMAL HIGH (ref 65–99)
Glucose-Capillary: 176 mg/dL — ABNORMAL HIGH (ref 65–99)
Glucose-Capillary: 185 mg/dL — ABNORMAL HIGH (ref 65–99)
Glucose-Capillary: 202 mg/dL — ABNORMAL HIGH (ref 65–99)
Glucose-Capillary: 221 mg/dL — ABNORMAL HIGH (ref 65–99)

## 2016-09-23 LAB — BASIC METABOLIC PANEL
ANION GAP: 5 (ref 5–15)
BUN: 13 mg/dL (ref 6–20)
CO2: 21 mmol/L — AB (ref 22–32)
Calcium: 7.8 mg/dL — ABNORMAL LOW (ref 8.9–10.3)
Chloride: 108 mmol/L (ref 101–111)
Creatinine, Ser: 0.57 mg/dL (ref 0.44–1.00)
GFR calc Af Amer: >60 mL/min (ref >60)
GFR calc non Af Amer: >60 mL/min (ref >60)
GLUCOSE: 183 mg/dL — AB (ref 65–99)
POTASSIUM: 3 mmol/L — AB (ref 3.5–5.1)
Sodium: 134 mmol/L — ABNORMAL LOW (ref 135–145)

## 2016-09-23 LAB — CBC
HEMATOCRIT: 37.7 % (ref 35.0–47.0)
HEMOGLOBIN: 12.7 g/dL (ref 12.0–16.0)
MCH: 28.6 pg (ref 26.0–34.0)
MCHC: 33.6 g/dL (ref 32.0–36.0)
MCV: 85.1 fL (ref 80.0–100.0)
Platelets: 219 10*3/uL (ref 150–440)
RBC: 4.43 MIL/uL (ref 3.80–5.20)
RDW: 13.6 % (ref 11.5–14.5)
WBC: 6.7 10*3/uL (ref 3.6–11.0)

## 2016-09-23 LAB — MAGNESIUM: MAGNESIUM: 1.8 mg/dL (ref 1.7–2.4)

## 2016-09-23 LAB — LIPID PANEL
Cholesterol: 209 mg/dL — ABNORMAL HIGH (ref 0–200)
HDL: 26 mg/dL — AB (ref >40)
LDL CALC: 134 mg/dL — AB (ref 0–99)
TRIGLYCERIDES: 245 mg/dL — AB (ref <150)
Total CHOL/HDL Ratio: 8 RATIO
VLDL: 49 mg/dL — ABNORMAL HIGH (ref 0–40)

## 2016-09-23 LAB — POTASSIUM: POTASSIUM: 3.2 mmol/L — AB (ref 3.5–5.1)

## 2016-09-23 MED ORDER — INSULIN REGULAR BOLUS VIA INFUSION
0.0000 [IU] | Freq: Three times a day (TID) | INTRAVENOUS | Status: DC
  Filled 2016-09-23: qty 10

## 2016-09-23 MED ORDER — METFORMIN HCL 500 MG PO TABS
500.0000 mg | ORAL_TABLET | Freq: Two times a day (BID) | ORAL | Status: DC
  Administered 2016-09-23: 500 mg via ORAL
  Filled 2016-09-23: qty 1

## 2016-09-23 MED ORDER — LIVING WELL WITH DIABETES BOOK
Freq: Once | Status: AC
  Administered 2016-09-23: 1
  Filled 2016-09-23: qty 1

## 2016-09-23 MED ORDER — INSULIN GLARGINE 100 UNIT/ML ~~LOC~~ SOLN
22.0000 [IU] | Freq: Every day | SUBCUTANEOUS | Status: DC
  Filled 2016-09-23 (×2): qty 0.22

## 2016-09-23 MED ORDER — DEXTROSE 50 % IV SOLN: 25.0000 mL | INTRAVENOUS | Status: DC | PRN

## 2016-09-23 MED ORDER — MAGNESIUM SULFATE 2 GM/50ML IV SOLN
2.0000 g | Freq: Once | INTRAVENOUS | Status: AC
  Administered 2016-09-23: 2 g via INTRAVENOUS
  Filled 2016-09-23: qty 50

## 2016-09-23 MED ORDER — INSULIN GLARGINE 100 UNIT/ML ~~LOC~~ SOLN
22.0000 [IU] | Freq: Every day | SUBCUTANEOUS | Status: DC
  Administered 2016-09-23: 22 [IU] via SUBCUTANEOUS
  Filled 2016-09-23: qty 0.22

## 2016-09-23 MED ORDER — LOVASTATIN 20 MG PO TABS: 40.0000 mg | ORAL_TABLET | Freq: Every day | ORAL | 3 refills | Status: DC

## 2016-09-23 MED ORDER — INSULIN STARTER KIT- PEN NEEDLES (ENGLISH): 1.0000 | Freq: Once | 0 refills | Status: AC

## 2016-09-23 MED ORDER — SODIUM CHLORIDE 0.9 % IV SOLN: INTRAVENOUS | Status: DC

## 2016-09-23 MED ORDER — ACETAMINOPHEN 325 MG PO TABS
650.0000 mg | ORAL_TABLET | Freq: Four times a day (QID) | ORAL | Status: DC | PRN
Start: 2016-09-23 — End: 2016-09-23
  Administered 2016-09-23: 650 mg via ORAL
  Filled 2016-09-23: qty 2

## 2016-09-23 MED ORDER — ENOXAPARIN SODIUM 40 MG/0.4ML ~~LOC~~ SOLN
40.0000 mg | Freq: Two times a day (BID) | SUBCUTANEOUS | Status: DC
  Administered 2016-09-23: 40 mg via SUBCUTANEOUS
  Filled 2016-09-23: qty 0.4

## 2016-09-23 MED ORDER — LIVING WELL WITH DIABETES BOOK
Freq: Once | Status: DC
  Filled 2016-09-23: qty 1

## 2016-09-23 MED ORDER — SODIUM CHLORIDE 0.9 % IV SOLN
30.0000 meq | Freq: Once | INTRAVENOUS | Status: AC
  Administered 2016-09-23: 30 meq via INTRAVENOUS
  Filled 2016-09-23: qty 15

## 2016-09-23 MED ORDER — INSULIN ASPART 100 UNIT/ML ~~LOC~~ SOLN
0.0000 [IU] | SUBCUTANEOUS | Status: DC
  Administered 2016-09-23: 8 [IU] via SUBCUTANEOUS
  Filled 2016-09-23: qty 8

## 2016-09-23 MED ORDER — INSULIN STARTER KIT- PEN NEEDLES (ENGLISH)
1.0000 | Freq: Once | Status: DC
  Filled 2016-09-23: qty 1

## 2016-09-23 MED ORDER — ATORVASTATIN CALCIUM 20 MG PO TABS
40.0000 mg | ORAL_TABLET | Freq: Every day | ORAL | Status: DC
  Administered 2016-09-23: 40 mg via ORAL
  Filled 2016-09-23: qty 2

## 2016-09-23 MED ORDER — DEXTROSE-NACL 5-0.45 % IV SOLN
INTRAVENOUS | Status: DC
  Administered 2016-09-23: 13:00:00 via INTRAVENOUS

## 2016-09-23 NOTE — Progress Notes (Signed)
Pharmacy Note  Patients BMI 42.7 and CrCl >39ml/min. Will change enoxaparin to 40mg  BID per pharmacy policy.   Loree Fee, PharmD 8:38 AM 09/23/2016

## 2016-09-23 NOTE — Care Management Note (Signed)
Case Management Note  Patient Details  Name: Tina Henderson MRN: [id number] Date of Birth: [birth year]  Subjective/Objective:                  Met with patient and her husband to discuss discharge planning. Patient states she plans to return home at discharge. She states she drives and independent with ambulation. She is in between PCP's now and wants to change to [name] (Female provider) as her husband is seen by Dr. Silvestre Moment. She is a new diabetic and unsure of how to manage this disease. She agrees to follow up at [name]. She does not have a glucometer. Patient states she has good drug coverage and denies problems obtaining medications.   Action/Plan: Glucometer Rx requested from Dr. Posey Pronto. Appointment made with Dr. Neldon Mc at [facility] and placed on follow up note- husband aware of appt date/time/urgent care until then. [facility] contact number placed also on discharge planning note for patient. I have faxed insurance card to [place] at [facility] [phone].  Expected Discharge Date:                  Expected Discharge Plan:     In-House Referral:  PCP / [facility]  Discharge planning Services  CM Consult  Post Acute Care Choice:    Choice offered to:  Patient, Spouse  DME Arranged:    DME Agency:     HH Arranged:    [place] Agency:     Status of Service:  Completed, signed off  If discussed at [place] of Stay Meetings, dates discussed:    Additional Comments:  Marshell Garfinkel, RN 09/23/2016, 11:30 AM

## 2016-09-23 NOTE — Discharge Summary (Signed)
[facility] NAME: Tina Henderson    MR#:  [id number]  DATE OF BIRTH:  07-06-1971  DATE OF ADMISSION:  09/22/2016 ADMITTING PHYSICIAN: Theodoro Grist, MD  DATE OF DISCHARGE: 09/23/16  PRIMARY CARE PHYSICIAN: Mar Daring, PA-C    ADMISSION DIAGNOSIS:  Diabetic ketoacidosis without coma associated with other specified diabetes mellitus ([place]) [E13.10]  DISCHARGE DIAGNOSIS:  Uncontrolled type 2 diabetes with significant hypoglycemia Acute renal failure resolved Hyponatremia secondary to elevated sugars Hyperlipidemia Morbid obesity  SECONDARY DIAGNOSIS:  History reviewed. No pertinent past medical history.  HOSPITAL COURSE:  Tina Henderson  is a 46 y.o. female with a known history of Hypertension, hyperlipidemia, anxiety, depression, asthma, diabetes mellitus type 2, obesity, who presents to the hospital with complaints of feeling well for the past few weeks, losing weight. Because of her fatigue and weakness, increased frequency of urination, dizziness, dry heaving, dry mouth, excessive drinking, nausea, discomfort in abdomen, patient was seen by her primary care physician on Monday  #1. uncontrolled type 2 diabetes with severe hyperglycemia -Patient came in with sugar of 750 and hemoglobin A1c 13.4,  = Received  insulin IV drip, IV fluids, following blood glucose levels frequently -Seen by diabetic educator, dietitian all information education given to patient. Glucometer prescribed. Lifestyle changes discussed.  Resume metformin 500 twice a day. Continue Lantus 22 units daily. Checks sugars at home. Follow-up with primary care as outpatient   #2. Dehydration with hemoconcentration, continue IV fluids  -Improved   #3. Acute renal insufficiency due to elevated sugars resolved with IV fluids and oral intake   #4. Hyponatremia due to elevated sugars   #5. Obesity exercise weight loss and lifestyle changes discussed  #6  hyperlipidemia increased dose of lovastatin  Patient overall feels weak how or labs have improved she is overall feeling much better than yesterday. We'll discharge her to home follow up as outpatient with primary care  Patient also [facility] bicarbonate measures CONSULTS OBTAINED:    DRUG ALLERGIES:  No Known Allergies  DISCHARGE MEDICATIONS:   Current Discharge Medication List    CONTINUE these medications which have CHANGED   Details  lovastatin (MEVACOR) 20 MG tablet Take 2 tablets (40 mg total) by mouth daily. Qty: 90 tablet, Refills: 3   Associated Diagnoses: Hypercholesteremia      CONTINUE these medications which have NOT CHANGED   Details  citalopram (CELEXA) 20 MG tablet Take 1.5 tablets (30 mg total) by mouth daily. Qty: 135 tablet, Refills: 3   Associated Diagnoses: Clinical depression    fluticasone (FLONASE) 50 MCG/ACT nasal spray Place 2 sprays into both nostrils daily. Qty: 16 g, Refills: 5    Insulin Glargine (LANTUS SOLOSTAR) 100 UNIT/ML Solostar Pen Inject 22 Units into the skin daily at 10 pm. Qty: 3 mL, Refills: 1   Associated Diagnoses: Type 2 diabetes mellitus without complication, without long-term current use of insulin (HCC)    loratadine (CLARITIN) 10 MG tablet Take 10 mg by mouth daily.    mometasone-formoterol (DULERA) 100-5 MCG/ACT AERO Inhale 2 puffs into the lungs daily as needed for wheezing.    triamterene-hydrochlorothiazide (MAXZIDE-25) 37.5-25 MG tablet Take 1 tablet by mouth daily. Qty: 90 tablet, Refills: 1   Associated Diagnoses: Essential (primary) hypertension    ALPRAZolam (XANAX) 0.5 MG tablet Take 1 tablet (0.5 mg total) by mouth 3 (three) times daily as needed. Qty: 60 tablet, Refills: 0   Associated Diagnoses:  Clinical depression    metFORMIN (GLUCOPHAGE) 500 MG tablet Take 1 tablet (500 mg total) by mouth 2 (two) times daily with a meal. Qty: 60 tablet, Refills: 1   Associated Diagnoses:  Type 2 diabetes mellitus without complication, without long-term current use of insulin ([place])        If you experience worsening of your admission symptoms, develop shortness of breath, life threatening emergency, suicidal or homicidal thoughts you must seek medical attention immediately by calling 911 or calling your MD immediately  if symptoms less severe.  You Must read complete instructions/literature along with all the possible adverse reactions/side effects for all the Medicines you take and that have been prescribed to you. Take any new Medicines after you have completely understood and accept all the possible adverse reactions/side effects.   Please note  You were cared for by a hospitalist during your hospital stay. If you have any questions about your discharge medications or the care you received while you were in the hospital after you are discharged, you can call the unit and asked to speak with the hospitalist on call if the hospitalist that took care of you is not available. Once you are discharged, your primary care physician will handle any further medical issues. Please note that NO REFILLS for any discharge medications will be authorized once you are discharged, as it is imperative that you return to your primary care physician (or establish a relationship with a primary care physician if you do not have one) for your aftercare needs so that they can reassess your need for medications and monitor your lab values. Today   SUBJECTIVE   Feel weak  VITAL SIGNS:  Blood pressure 129/68, pulse 90, temperature 98 F (36.7 C), temperature source Oral, resp. rate (!) 22, height 5\' 3"  (1.6 m), weight 109.2 kg (240 lb 11.9 oz), SpO2 96 %.  I/O:   Intake/Output Summary (Last 24 hours) at 09/23/16 1447 Last data filed at 09/23/16 1427  Gross per 24 hour  Intake          3715.69 ml  Output                1 ml  Net          3714.69 ml    PHYSICAL EXAMINATION:  GENERAL:  46  y.o.-year-old patient lying in the bed with no acute distress. Obese EYES: Pupils equal, round, reactive to light and accommodation. No scleral icterus. Extraocular muscles intact.  HEENT: Head atraumatic, normocephalic. Oropharynx and nasopharynx clear.  NECK:  Supple, no jugular venous distention. No thyroid enlargement, no tenderness.  LUNGS: Normal breath sounds bilaterally, no wheezing, rales,rhonchi or crepitation. No use of accessory muscles of respiration.  CARDIOVASCULAR: S1, S2 normal. No murmurs, rubs, or gallops.  ABDOMEN: Soft, non-tender, non-distended. Bowel sounds present. No organomegaly or mass.  EXTREMITIES: No pedal edema, cyanosis, or clubbing.  NEUROLOGIC: Cranial nerves II through XII are intact. Muscle strength 5/5 in all extremities. Sensation intact. Gait not checked.  PSYCHIATRIC: The patient is alert and oriented x 3.  SKIN: No obvious rash, lesion, or ulcer.   DATA REVIEW:   CBC   Recent Labs Lab 09/23/16 0555  WBC 6.7  HGB 12.7  HCT 37.7  PLT 219    Chemistries   Recent Labs Lab 09/20/16 1655  09/23/16 0555  NA 126*  < > 134*  K 5.1  < > 3.0*  CL 80*  < > 108  CO2 23  < >  21*  GLUCOSE 750*  < > 183*  BUN 17  < > 13  CREATININE 1.14*  < > 0.57  CALCIUM 10.1  < > 7.8*  MG  --   --  1.8  AST 26  --   --   ALT 39*  --   --   ALKPHOS 158*  --   --   BILITOT 0.5  --   --   < > = values in this interval not displayed.  Microbiology Results   Recent Results (from the past 240 hour(s))  MRSA PCR Screening     Status: None   Collection Time: 09/22/16  9:16 PM  Result Value Ref Range Status   MRSA by PCR NEGATIVE NEGATIVE Final    Comment:        The GeneXpert MRSA Assay (FDA approved for NASAL specimens only), is one component of a comprehensive MRSA colonization surveillance program. It is not intended to diagnose MRSA infection nor to guide or monitor treatment for MRSA infections.     RADIOLOGY:  No results  found.   Management plans discussed with the patient, family and they are in agreement.  CODE STATUS:     Code Status Orders        Start     Ordered   09/22/16 1752  Full code  Continuous     09/22/16 1751    Code Status History    Date Active Date Inactive Code Status Order ID Comments User Context   This patient has a current code status but no historical code status.      TOTAL TIME TAKING CARE OF THIS PATIENT: 40 minutes.    Kyrra Prada M.D on 09/23/2016 at 2:47 PM  Between 7am to 6pm - Pager - [phone] After 6pm go to [email or web address] - password [email or web address] [facility]  Office  [phone]  CC: Primary care physician; Mar Daring, PA-C

## 2016-09-23 NOTE — Progress Notes (Signed)
3:17 PM       Dr. Posey Pronto called and notified of 3 CBG's below 250. New orders given to transition off IV insulin. Also new orders for restarting Lantus  22units once per day, and metformin 500 mg bid. Orders read back and acknowledged. Will continue to assess.

## 2016-09-23 NOTE — Progress Notes (Signed)
Pt currently alert and oriented. Tolerating diet well. Pt open to education and asking appropriate questions. Pt transitioning to hyperglycemia protocol and off DKA protocol per verbal orders from [place]. Will continue to assess.

## 2016-09-23 NOTE — Progress Notes (Signed)
Spoke with Dr. Ara Kussmaul regarding discharging patient. Dr. Ara Kussmaul agreed with discharge, after pt receives Novolog. Pt last CBG 245 Dr.Hugelmeyer acknowledged, orders to continue with discharge.

## 2016-09-23 NOTE — Progress Notes (Signed)
Inpatient Diabetes Program Recommendations  AACE/ADA: New Consensus Statement on Inpatient Glycemic Control (2015)  Target Ranges:  Prepandial:   less than 140 mg/dL      Peak postprandial:   less than 180 mg/dL (1-2 hours)      Critically ill patients:  140 - 180 mg/dL   Lab Results  Component Value Date   GLUCAP 215 (H) 09/23/2016   HGBA1C 13.4 (H) 09/20/2016     Inpatient Diabetes Program Recommendations:  Met with patient regarding her diabetes- she tells me that when she saw the MD in 05/2016 she was not told she had diabetes, she was told she had pre-diabetes.  She does admit that she did not go for education with the dietitian as ordered and stopped taking Metformin soon after it was started because of stomach upset.   We reviewed the symptoms of hyperglycemia including vision problems and lethargy (both of which she was experiencing), what an A1C is, the importance of checking sugars (recommend bid), of taking medication as ordered and Metformin on a full stomach.  We discussed the importance of 3 meals and no sweetened beverages including juices.  We discussed meal planning.    Stuti did give herself insulin using the insulin pen 2 nights this past week- I reviewed the importance of site rotation, 2 unit air shot before giving and disposal of the needles.  I have stressed the importance of following up with her MD early next week and to bring her blood sugars with her so the MD can make adjustments.   She verbalized understanding.  I have encouraged her to give herself her insulin while she is inpatient. She tells me she has already been on the [employer] website and is anxious to go to the [facility] for education.  Gentry Fitz, RN, BA, MHA, CDE Diabetes Coordinator Inpatient Diabetes Program  [phone] (Team Pager) [phone] ([place]) 09/23/2016 3:08 PM

## 2016-09-23 NOTE — Plan of Care (Signed)
Problem: Food- and Nutrition-Related Knowledge Deficit (NB-1.1) Goal: Nutrition education Formal process to instruct or train a patient/client in a skill or to impart knowledge to help patients/clients voluntarily manage or modify food choices and eating behavior to maintain or improve health. Outcome: Completed/Met Date Met: 09/23/16  RD consulted for nutrition education regarding diabetes.  Pt newly dx DM.   Lab Results  Component Value Date   HGBA1C 13.4 (H) 09/20/2016    RD provided "Carbohydrate Counting for People with Diabetes" handout from the Academy of Nutrition and Dietetics. Discussed different food groups and their effects on blood sugar, emphasizing carbohydrate-containing foods. Provided list of carbohydrates and recommended serving sizes of common foods. Reviewed basic carb counting and label reading.   Discussed importance of controlled and consistent carbohydrate intake throughout the day. Provided examples of ways to balance meals/snacks and encouraged intake of high-fiber, whole grain complex carbohydrates. Teach back method used.  Expect good compliance. Recommend further education as outpatient  Body mass index is 42.65 kg/m. Pt meets criteria for morbid obesity based on current BMI.  Current diet order is Carb Modified/Heart Healthy, patient is consuming approximately 100% of meals at this time, appetite good. Labs and medications reviewed. No further nutrition interventions warranted at this time. RD contact information provided. If additional nutrition issues arise, please re-consult RD.  Kerman Passey [place], [place], LDN [phone] Pager  [phone] Weekend/On-Call Pager

## 2016-09-23 NOTE — Progress Notes (Signed)
MEDICATION RELATED CONSULT NOTE   Pharmacy Consult for electrolyte management  Indication: hypokalemia  Assessment: 45yoF who was recently diagnosed with diabetes. A1c of 13.4%. She presents with DKA to the hospital. Currently on an insulin drip. Anion gap has now closed.   Plan:  1. Electrolytes: K 3.0- subtherapeutic. Patient receiving potassium chloride 21mEq IV x1. Will check potassium at 1800 tonight. Mg=1.8; Will replace with magnesium sulfate 2g IV x1. Will recheck magnesium and potassium in the AM.   No Known Allergies  Patient Measurements: Height: 5\' 3"  (160 cm) Weight: 240 lb 11.9 oz (109.2 kg) IBW/kg (Calculated) : 52.4   Vital Signs: Temp: 97.8 F (36.6 C) (01/11 0800) Temp Source: Oral (01/11 0800) BP: 119/82 (01/11 0900) Pulse Rate: 77 (01/11 0900) Intake/Output from previous day: 01/10 0701 - 01/11 0700 In: 2747.8 [P.O.:240; I.V.:2507.8] Out: -  Intake/Output from this shift: Total I/O In: 415.9 [I.V.:415.9] Out: 1 [Urine:1]  Labs:  Recent Labs  09/20/16 1655 09/22/16 1018 09/22/16 2318 09/23/16 0555  WBC 10.7 10.4  --  6.7  HGB  --  15.8  --  12.7  HCT 49.0* 47.4*  --  37.7  PLT 318 274  --  219  CREATININE 1.14* 0.86 0.56 0.57  ALBUMIN 4.6  --   --   --   PROT 7.4  --   --   --   AST 26  --   --   --   ALT 39*  --   --   --   ALKPHOS 158*  --   --   --   BILITOT 0.5  --   --   --    Estimated Creatinine Clearance (by C-G formula based on SCr of 0.57 mg/dL) Female: 105.3 mL/min Female: 128.3 mL/min   Microbiology: Recent Results (from the past 720 hour(s))  MRSA PCR Screening     Status: None   Collection Time: 09/22/16  9:16 PM  Result Value Ref Range Status   MRSA by PCR NEGATIVE NEGATIVE Final    Comment:        The GeneXpert MRSA Assay (FDA approved for NASAL specimens only), is one component of a comprehensive MRSA colonization surveillance program. It is not intended to diagnose MRSA infection nor to guide or monitor  treatment for MRSA infections.      Loree Fee, PharmD 09/23/2016,11:53 AM

## 2016-09-23 NOTE — Progress Notes (Addendum)
Inpatient Diabetes Program Recommendations  AACE/ADA: New Consensus Statement on Inpatient Glycemic Control (2015)  Target Ranges:  Prepandial:   less than 140 mg/dL      Peak postprandial:   less than 180 mg/dL (1-2 hours)      Critically ill patients:  140 - 180 mg/dL   Lab Results  Component Value Date   GLUCAP 195 (H) 09/23/2016   HGBA1C 13.4 (H) 09/20/2016    Review of Glycemic Control  Results for DORETTA, REMMERT (MRN [id number]) as of 09/23/2016 08:02  Ref. Range 09/23/2016 03:19 09/23/2016 04:20 09/23/2016 05:26 09/23/2016 06:24 09/23/2016 07:27  Glucose-Capillary Latest Ref Range: 65 - 99 mg/dL 173 (H) 171 (H) 185 (H) 177 (H) 195 (H)     Diabetes history: Type 2 Outpatient Diabetes medications: Metformin 555m bid, Lantus 22 units qhs (ordered on 09/20/16) Current orders for Inpatient glycemic control: IV insulin via DKA order set.   Inpatient Diabetes Program Recommendations:  CO2 21 and Anion Gap 5.  Patient can be transitioned off IV insulin when ordered by MD and when  4 CBG range from 140-1828mdl.   Bolus insulin (Lantus) needs to be given 2 hours prior to the insulin drip being d/c then correction insulin should be given at the time of transition off the drip.   Transition orders should include basal, meal coverage and correction insulin.  D/c phase 1 when patient is transitioned to phase 2.   Outpatient education ordered, Living Well with Diabetes, RD consult and insulin teaching kit ordered.   JuGentry FitzRN, BA, MHA, CDE Diabetes Coordinator Inpatient Diabetes Program  [phone] Pager) [phone]/07/2017 8:20 AM

## 2016-09-23 NOTE — Discharge Instructions (Signed)
Diabetes Mellitus and Sick Day Management Blood sugar (glucose) can be difficult to control when you are sick. Common illnesses that can cause problems for people with diabetes (diabetes mellitus) include colds, fever, flu (influenza), nausea, vomiting, and diarrhea. These illnesses can cause stress and loss of body fluids (dehydration), and those issues can cause blood glucose levels to increase. Because of this, it is very important to take your insulin and diabetes medicines and eat some form of carbohydrate when you are sick. You should make a plan for days when you are sick (sick day plan) as part of your diabetes management plan. You and your health care provider should make this plan in advance. The following guidelines are intended to help you manage an illness that lasts for about 24 hours or less. Your health care provider may also give you more specific instructions. What do I need to do to manage my blood glucose?  Check your blood glucose every 2-4 hours, or as often as told by your health care provider.  Know your sick day treatment goals. Your target blood glucose levels may be different when you are sick.  If you use insulin, take your usual dose.  If your blood glucose continues to be too high, you may need to take an additional insulin dose as told by your health care provider.  If you use oral diabetes medicine, you may need to stop taking it if you are not able to eat or drink normally. Ask your health care provider about whether you need to stop taking these medicines while you are sick.  If you use injectable hormone medicines other than insulin to control your diabetes, ask your health care provider about whether you need to stop taking these medicines while you are sick. What else can I do to manage my diabetes when I am sick? Check your ketones  If you have type 1 diabetes, check your urine ketones every 4 hours.  If you have type 2 diabetes, check your urine ketones as  often as told by your health care provider. Drink fluids  Drink enough fluid to keep your urine clear or pale yellow. This is especially important if you have a fever, vomiting, or diarrhea. Those symptoms can lead to dehydration.  Follow any instructions from your health care provider about beverages to avoid.  Do not drink alcohol, caffeine, or drinks that contain a lot of sugar. Take medicines as directed  Take-over-the-counter and prescription medicines only as told by your health care provider.  Check medicine labels for added sugars. Some medicines may contain sugar or types of sugars that can raise your blood glucose level. What foods can I eat when I am sick? You need to eat some form of carbohydrates when you are sick. You should eat 45-50 grams (45-50 g) of carbohydrates every 3-4 hours until you feel better. All of the food choices below contain about 15 g of carbohydrates. Plan ahead and keep some of these foods around so you have them if you get sick.  4-6 oz (120-177 mL) carbonated beverage that contains sugar, such as regular (not diet) soda. You may be able to drink carbonated beverages more easily if you open the beverage and let it sit at room temperature for a few minutes before drinking.   of a twin frozen ice pop.  4 oz (120 g) regular gelatin.  4 oz (120 mL) fruit juice.  4 oz (120 g) ice cream or frozen yogurt.  2 oz (60  g) sherbet.  8 oz (240 mL) clear broth or soup.  4 oz (120 g) regular custard.  4 oz (120 g) regular pudding.  8 oz (240 g) plain yogurt.  1 slice bread or toast.  6 saltine crackers.  5 vanilla wafers. Questions to ask your health care provider Consider asking the following questions so you know what to do on days when you are sick:  Should I adjust my diabetes medicines?  How often do I need to check my blood glucose?  What supplies do I need to manage my diabetes at home when I am sick?  What number can I call if I have  questions?  What foods and drinks should I avoid? Contact a health care provider if:  You develop symptoms of diabetic ketoacidosis, such as:  Fatigue.  Weight loss.  Excessive thirst.  Light-headedness.  Fruity or sweet-smelling breath.  Excessive urination.  Vision changes.  Confusion or irritability.  Nausea.  Vomiting.  Rapid breathing.  Pain in the abdomen.  Feeling flushed.  You are unable to drink fluids without vomiting.  You have any of the following for more than 6 hours:  Nausea.  Vomiting.  Diarrhea.  Your blood glucose is at or above 240 mg/dL (13.3 mmol/L), even after you take an additional insulin dose.  You have a change in how you think, feel, or act (mental status).  You develop another serious illness.  You have been sick or have had a fever for 2 days or longer and you are not getting better. Get help right away if:  Your blood glucose is lower than 54 mg/dL (3.0 mmol/L).  You have difficulty breathing.  You have moderate or high ketone levels in your urine.  You used emergency glucagon to treat low blood glucose. Summary  Blood sugar (glucose) can be difficult to control when you are sick. Common illnesses that can cause problems for people with diabetes (diabetes mellitus) include colds, fever, flu (influenza), nausea, vomiting, and diarrhea.  Illnesses can cause stress and loss of body fluids (dehydration), and those issues can cause blood glucose levels to increase.  Make a plan for days when you are sick (sick day plan) as part of your diabetes management plan. You and your health care provider should make this plan in advance.  It is very important to take your insulin and diabetes medicines and to eat some form of carbohydrate when you are sick.  Contact your health care provider if have problems managing your blood glucose levels when you are sick, or if you have been sick or had a fever for 2 days or longer and are not  getting better. This information is not intended to replace advice given to you by your health care provider. Make sure you discuss any questions you have with your health care provider. Document Released: 09/02/2003 Document Revised: 05/28/2016 Document Reviewed: 05/28/2016 Elsevier Interactive Patient Education  2017 [place].   Diabetes Mellitus and Exercise Exercising regularly is important for your overall health, especially when you have diabetes (diabetes mellitus). Exercising is not only about losing weight. It has many health benefits, such as increasing muscle strength and bone density and reducing body fat and stress. This leads to improved fitness, flexibility, and endurance, all of which result in better overall health. Exercise has additional benefits for people with diabetes, including:  Reducing appetite.  Helping to lower and control blood glucose.  Lowering blood pressure.  Helping to control amounts of fatty substances (lipids)  in the blood, such as cholesterol and triglycerides.  Helping the body to respond better to insulin (improving insulin sensitivity).  Reducing how much insulin the body needs.  Decreasing the risk for heart disease by:  Lowering cholesterol and triglyceride levels.  Increasing the levels of good cholesterol.  Lowering blood glucose levels. What is my activity plan? Your health care provider or certified diabetes educator can help you make a plan for the type and frequency of exercise (activity plan) that works for you. Make sure that you:  Do at least 150 minutes of moderate-intensity or vigorous-intensity exercise each week. This could be brisk walking, biking, or water aerobics.  Do stretching and strength exercises, such as yoga or weightlifting, at least 2 times a week.  Spread out your activity over at least 3 days of the week.  Get some form of physical activity every day.  Do not go more than 2 days in a row without some  kind of physical activity.  Avoid being inactive for more than 90 minutes at a time. Take frequent breaks to walk or stretch.  Choose a type of exercise or activity that you enjoy, and set realistic goals.  Start slowly, and gradually increase the intensity of your exercise over time. What do I need to know about managing my diabetes?  Check your blood glucose before and after exercising.  If your blood glucose is higher than 240 mg/dL (13.3 mmol/L) before you exercise, check your urine for ketones. If you have ketones in your urine, do not exercise until your blood glucose returns to normal.  Know the symptoms of low blood glucose (hypoglycemia) and how to treat it. Your risk for hypoglycemia increases during and after exercise. Common symptoms of hypoglycemia can include:  Hunger.  Anxiety.  Sweating and feeling clammy.  Confusion.  Dizziness or feeling light-headed.  Increased heart rate or palpitations.  Blurry vision.  Tingling or numbness around the mouth, lips, or tongue.  Tremors or shakes.  Irritability.  Keep a rapid-acting carbohydrate snack available before, during, and after exercise to help prevent or treat hypoglycemia.  Avoid injecting insulin into areas of the body that are going to be exercised. For example, avoid injecting insulin into:  The arms, when playing tennis.  The legs, when jogging.  Keep records of your exercise habits. Doing this can help you and your health care provider adjust your diabetes management plan as needed. Write down:  Food that you eat before and after you exercise.  Blood glucose levels before and after you exercise.  The type and amount of exercise you have done.  When your insulin is expected to peak, if you use insulin. Avoid exercising at times when your insulin is peaking.  When you start a new exercise or activity, work with your health care provider to make sure the activity is safe for you, and to adjust your  insulin, medicines, or food intake as needed.  Drink plenty of water while you exercise to prevent dehydration or heat stroke. Drink enough fluid to keep your urine clear or pale yellow. This information is not intended to replace advice given to you by your health care provider. Make sure you discuss any questions you have with your health care provider. Document Released: 11/20/2003 Document Revised: 03/19/2016 Document Reviewed: 02/09/2016 Elsevier Interactive Patient Education  2017 [place].   Diabetes Mellitus and Standards of Medical Care Managing diabetes (diabetes mellitus) can be complicated. Your diabetes treatment may be managed by a team  of health care providers, including:  A diet and nutrition specialist (registered dietitian).  A nurse.  A certified diabetes educator (CDE).  A diabetes specialist (endocrinologist).  An eye doctor.  A primary care provider.  A dentist. Your health care providers follow a schedule in order to help you get the best quality of care. The following schedule is a general guideline for your diabetes management plan. Your health care providers may also give you more specific instructions. HbA1c ( hemoglobin A1c) test This test provides information about blood sugar (glucose) control over the previous 2-3 months. It is used to check whether your diabetes management plan needs to be adjusted.  If you are meeting your treatment goals, this test is done at least 2 times a year.  If you are not meeting treatment goals or if your treatment goals have changed, this test is done 4 times a year. Blood pressure test  This test is done at every routine medical visit. For most people, the goal is less than 140/90. In some cases, your goal blood pressure may be 130/80 or less. Ask your health care provider what your goal blood pressure should be. Dental and eye exams  Visit your dentist two times a year.  If you have type 1 diabetes, get an eye  exam 3-5 years after you are diagnosed, and then once a year after your first exam.  If you were diagnosed with type 1 diabetes as a child, get an eye exam when you are age 50 or older and have had diabetes for 3-5 years. After the first exam, you should get an eye exam once a year.  If you have type 2 diabetes, have an eye exam as soon as you are diagnosed, and then once a year after your first exam. Foot care exam  Visual foot exams are done at every routine medical visit. The exams check for cuts, bruises, redness, blisters, sores, or other problems with the feet.  A complete foot exam is done by your health care provider once a year. This exam includes an inspection of the structure and skin of your feet, and a check of the pulses and sensation in your feet.  Type 1 diabetes: Get your first exam 3-5 years after diagnosis.  Type 2 diabetes: Get your first exam as soon as you are diagnosed.  Check your feet every day for cuts, bruises, redness, blisters, or sores. If you have any of these or other problems that are not healing, contact your health care provider. Kidney function test ( urine microalbumin)  This test is done once a year.  Type 1 diabetes: Get your first test 5 years after diagnosis.  Type 2 diabetes: Get your first test as soon as you are diagnosed.  If you have chronic kidney disease (CKD), get a serum creatinine and estimated glomerular filtration rate (eGFR) test once a year. Lipid profile (cholesterol, HDL, LDL, triglycerides)  This test should be done when you are diagnosed with diabetes, and every 5 years after the first test. If you are on medicines to lower your cholesterol, you may need to get this test done every year.  The goal for LDL is less than 100 mg/dL (5.5 mmol/L). If you are at high risk, the goal is less than 70 mg/dL (3.9 mmol/L).  The goal for HDL is 40 mg/dL (2.2 mmol/L) for men and 50 mg/dL(2.8 mmol/L) for women. An HDL cholesterol of 60 mg/dL  (3.3 mmol/L) or higher gives some protection against  heart disease.  The goal for triglycerides is less than 150 mg/dL (8.3 mmol/L). Immunizations  The yearly flu (influenza) vaccine is recommended for everyone 6 months or older who has diabetes.  The pneumonia (pneumococcal) vaccine is recommended for everyone 2 years or older who has diabetes. If you are 20 or older, you may get the pneumonia vaccine as a series of two separate shots.  The hepatitis B vaccine is recommended for adults shortly after they have been diagnosed with diabetes.  The Tdap (tetanus, diphtheria, and pertussis) vaccine should be given:  According to normal childhood vaccination schedules, for children.  Every 10 years, for adults who have diabetes.  The shingles vaccine is recommended for people who have had chicken pox and are 50 years or older. Mental and emotional health  Screening for symptoms of eating disorders, anxiety, and depression is recommended at the time of diagnosis and afterward as needed. If your screening shows that you have symptoms (you have a positive screening result), you may need further evaluation and be referred to a mental health care provider. Diabetes self-management education  Education about how to manage your diabetes is recommended at diagnosis and ongoing as needed. Treatment plan  Your treatment plan will be reviewed at every medical visit. Summary  Managing diabetes (diabetes mellitus) can be complicated. Your diabetes treatment may be managed by a team of health care providers.  Your health care providers follow a schedule in order to help you get the best quality of care.  Standards of care including having regular physical exams, blood tests, blood pressure monitoring, immunizations, screening tests, and education about how to manage your diabetes.  Your health care providers may also give you more specific instructions based on your individual health. This  information is not intended to replace advice given to you by your health care provider. Make sure you discuss any questions you have with your health care provider. Document Released: 06/27/2009 Document Revised: 05/28/2016 Document Reviewed: 05/28/2016 Elsevier Interactive Patient Education  2017 [employer]  Diabetes Mellitus and Food It is important for you to manage your blood sugar (glucose) level. Your blood glucose level can be greatly affected by what you eat. Eating healthier foods in the appropriate amounts throughout the day at about the same time each day will help you control your blood glucose level. It can also help slow or prevent worsening of your diabetes mellitus. Healthy eating may even help you improve the level of your blood pressure and reach or maintain a healthy weight. General recommendations for healthful eating and cooking habits include:  Eating meals and snacks regularly. Avoid going long periods of time without eating to lose weight.  Eating a diet that consists mainly of plant-based foods, such as fruits, vegetables, nuts, legumes, and whole grains.  Using low-heat cooking methods, such as baking, instead of high-heat cooking methods, such as deep frying. Work with your dietitian to make sure you understand how to use the Nutrition Facts information on food labels. How can food affect me? Carbohydrates  Carbohydrates affect your blood glucose level more than any other type of food. Your dietitian will help you determine how many carbohydrates to eat at each meal and teach you how to count carbohydrates. Counting carbohydrates is important to keep your blood glucose at a healthy level, especially if you are using insulin or taking certain medicines for diabetes mellitus. Alcohol  Alcohol can cause sudden decreases in blood glucose (hypoglycemia), especially if you use insulin or take certain  medicines for diabetes mellitus. Hypoglycemia can be a life-threatening  condition. Symptoms of hypoglycemia (sleepiness, dizziness, and disorientation) are similar to symptoms of having too much alcohol. If your health care provider has given you approval to drink alcohol, do so in moderation and use the following guidelines:  Women should not have more than one drink per day, and men should not have more than two drinks per day. One drink is equal to:  12 oz of beer.  5 oz of wine.  1 oz of hard liquor.  Do not drink on an empty stomach.  Keep yourself hydrated. Have water, diet soda, or unsweetened iced tea.  Regular soda, juice, and other mixers might contain a lot of carbohydrates and should be counted. What foods are not recommended? As you make food choices, it is important to remember that all foods are not the same. Some foods have fewer nutrients per serving than other foods, even though they might have the same number of calories or carbohydrates. It is difficult to get your body what it needs when you eat foods with fewer nutrients. Examples of foods that you should avoid that are high in calories and carbohydrates but low in nutrients include:  Trans fats (most processed foods list trans fats on the Nutrition Facts label).  Regular soda.  Juice.  Candy.  Sweets, such as cake, pie, doughnuts, and cookies.  Fried foods. What foods can I eat? Eat nutrient-rich foods, which will nourish your body and keep you healthy. The food you should eat also will depend on several factors, including:  The calories you need.  The medicines you take.  Your weight.  Your blood glucose level.  Your blood pressure level.  Your cholesterol level. You should eat a variety of foods, including:  Protein.  Lean cuts of meat.  Proteins low in saturated fats, such as fish, egg whites, and beans. Avoid processed meats.  Fruits and vegetables.  Fruits and vegetables that may help control blood glucose levels, such as apples, mangoes, and  yams.  Dairy products.  Choose fat-free or low-fat dairy products, such as milk, yogurt, and cheese.  Grains, bread, pasta, and rice.  Choose whole grain products, such as multigrain bread, whole oats, and brown rice. These foods may help control blood pressure.  Fats.  Foods containing healthful fats, such as nuts, avocado, olive oil, canola oil, and fish. Does everyone with diabetes mellitus have the same meal plan? Because every person with diabetes mellitus is different, there is not one meal plan that works for everyone. It is very important that you meet with a dietitian who will help you create a meal plan that is just right for you. This information is not intended to replace advice given to you by your health care provider. Make sure you discuss any questions you have with your health care provider. Document Released: 05/27/2005 Document Revised: 02/05/2016 Document Reviewed: 07/27/2013 Elsevier Interactive Patient Education  2017 [place].   Diabetes Mellitus and Skin Care Diabetes (diabetes mellitus) can lead to health problems over time, including skin problems. People with diabetes have a higher risk for many types of skin complications. This is because having poorly controlled blood sugar (glucose) levels can:  Damage nerves and blood vessels. This can result in decreased feeling in your legs and feet, which means you may not notice minor skin injuries that could lead to serious problems.  Reduce blood flow (circulation), which makes wounds heal more slowly and increases your risk of  infection.  Cause areas of skin to become thick or discolored. What are some common skin conditions that affect people with diabetes? Diabetes often causes dry skin. It can also cause the skin on the feet to get thinner, break more easily, and heal more slowly. There are certain skin conditions that commonly affect people who have diabetes, such as:  Bacterial skin infections, such as  styes, boils, infected hair follicles, and infections of the skin around the nails.  Fungal skin infections. These are most common in areas where skin rubs together, such as in the armpits or under the breasts.  Open sores, especially on the feet.  Tissue death (gangrene). This can happen on your feet if a serious infection does not heal properly. Gangrene can cause the need for a foot or leg to be surgically removed (amputated). Diabetes can also cause the skin to change. You may develop:  Dark, velvety markings on the skin that usually appear on the face, neck, armpits, inner thighs, and groin (acanthosis nigricans). This typically affects people of African-American and American-Indian descent.  Red, raised, scar-like tissue that may itch, feel painful, or develop into a wound (necrobiosis lipoidica).  Blisters on feet, toes, hands, or fingers.  Thickened, wax-like areas of skin that usually occur on the hands, forehead, or toes (digital sclerosis).  Brown or red ring-shaped or half-ring-shaped patches of skin on the ears or fingers (disseminated granuloma).  Pea-shaped yellow bumps that may be itchy and surrounded by a red ring (eruptive xanthomatosis). This usually affects the arms, feet, buttocks, and the top of the hands.  Round, discolored patches of tan skin that do not hurt or itch (diabetic dermopathy). These may look like age spots. What do I need to know about itchy skin? It is common for people with diabetes to have itchy skin caused by dryness. Frequent high blood glucose levels can cause itchiness, and poor circulation and certain skin infections can make dry, itchy skin worse. If you have itchy skin that is red or covered in a rash, this could be a sign of an allergic reaction to a medicine. If you have a rash or if your skin is very itchy, contact your health care provider. You may need help to manage your diabetes better, or you may need treatment for an infection. How can  I prevent skin breakdown? When you have diabetes and you get a badly infected ulcer or sore that does not heal, your skin can break down, especially if you have poor circulation or are on bed rest. To prevent skin breakdown:  Keep your skin clean and dry. Wash your skin often. Do not use hot water.  Do not use any products that contain nicotine or tobacco, such as cigarettes and e-cigarettes. Smoking affects the bodys ability to heal. If you need help quitting, ask your health care provider.  Check your skin every day for cuts, bruises, redness, blisters, or sores, especially on your feet. Tell your health care provider about any cuts, wounds, or sores you have, especially if they are healing slowly.  If you are on bed rest, try to change positions often. What else do I need to know about taking care of my skin?   To relieve dry skin and itching:  Limit baths and showers to 5-10 minutes.  Bathe with lukewarm water instead of hot water.  Use mild soap and gentle skin cleansers. Do not use soap that is perfumed or harsh or dries your skin.  Put on lotion  as soon as you finish bathing.  Make sure that your health care provider performs a visual foot exam at every medical visit.  Schedule a foot exam with your health care provider once every year. This exam includes an inspection of the structure and skin of your feet.  If you get a skin injury, such as a cut, blister, or sore, check the area every day for signs of infection. Check for:  More redness, swelling, or pain.  More fluid or blood.  Warmth.  Pus or a bad smell. Contact a health care provider if:  You develop a cut or sore, especially on your feet.  You develop signs of infection after a skin injury.  Your blood glucose level is higher than 240 mg/dL (13.3 mmol/L) for 2 days in a row.  You have itchy skin that develops redness or a rash.  You have discolored areas of skin.  You have areas where your skin is  changing, such as thickening or appearing shiny. This information is not intended to replace advice given to you by your health care provider. Make sure you discuss any questions you have with your health care provider. Document Released: 02/10/2016 Document Revised: 03/19/2016 Document Reviewed: 02/10/2016 Elsevier Interactive Patient Education  2017 [employer].   Diabetes and Foot Care Diabetes may cause you to have problems because of poor blood supply (circulation) to your feet and legs. This may cause the skin on your feet to become thinner, break easier, and heal more slowly. Your skin may become dry, and the skin may peel and crack. You may also have nerve damage in your legs and feet causing decreased feeling in them. You may not notice minor injuries to your feet that could lead to infections or more serious problems. Taking care of your feet is one of the most important things you can do for yourself. Follow these instructions at home:  Wear shoes at all times, even in the house. Do not go barefoot. Bare feet are easily injured.  Check your feet daily for blisters, cuts, and redness. If you cannot see the bottom of your feet, use a mirror or ask someone for help.  Wash your feet with warm water (do not use hot water) and mild soap. Then pat your feet and the areas between your toes until they are completely dry. Do not soak your feet as this can dry your skin.  Apply a moisturizing lotion or petroleum jelly (that does not contain alcohol and is unscented) to the skin on your feet and to dry, brittle toenails. Do not apply lotion between your toes.  Trim your toenails straight across. Do not dig under them or around the cuticle. File the edges of your nails with an emery board or nail file.  Do not cut corns or calluses or try to remove them with medicine.  Wear clean socks or stockings every day. Make sure they are not too tight. Do not wear knee-high stockings since they may  decrease blood flow to your legs.  Wear shoes that fit properly and have enough cushioning. To break in new shoes, wear them for just a few hours a day. This prevents you from injuring your feet. Always look in your shoes before you put them on to be sure there are no objects inside.  Do not cross your legs. This may decrease the blood flow to your feet.  If you find a minor scrape, cut, or break in the skin on your feet,  keep it and the skin around it clean and dry. These areas may be cleansed with mild soap and water. Do not cleanse the area with peroxide, alcohol, or iodine.  When you remove an adhesive bandage, be sure not to damage the skin around it.  If you have a wound, look at it several times a day to make sure it is healing.  Do not use heating pads or hot water bottles. They may burn your skin. If you have lost feeling in your feet or legs, you may not know it is happening until it is too late.  Make sure your health care provider performs a complete foot exam at least annually or more often if you have foot problems. Report any cuts, sores, or bruises to your health care provider immediately. Contact a health care provider if:  You have an injury that is not healing.  You have cuts or breaks in the skin.  You have an ingrown nail.  You notice redness on your legs or feet.  You feel burning or tingling in your legs or feet.  You have pain or cramps in your legs and feet.  Your legs or feet are numb.  Your feet always feel cold. Get help right away if:  There is increasing redness, swelling, or pain in or around a wound.  There is a red line that goes up your leg.  Pus is coming from a wound.  You develop a fever or as directed by your health care provider.  You notice a bad smell coming from an ulcer or wound. This information is not intended to replace advice given to you by your health care provider. Make sure you discuss any questions you have with your  health care provider. Document Released: 08/27/2000 Document Revised: 02/05/2016 Document Reviewed: 02/06/2013 Elsevier Interactive Patient Education  2017 [employer].

## 2016-09-24 ENCOUNTER — Telehealth: Payer: Self-pay | Admitting: Family Medicine

## 2016-09-24 LAB — BLOOD GAS, ARTERIAL
FIO2: 0.21
PCO2 ART: 19 mmHg — AB (ref 32.0–48.0)
Patient temperature: 37
pH, Arterial: 7.34 — ABNORMAL LOW (ref 7.350–7.450)
pO2, Arterial: 183 mmHg — ABNORMAL HIGH (ref 83.0–108.0)

## 2016-09-27 ENCOUNTER — Telehealth: Payer: Self-pay

## 2016-09-27 NOTE — Telephone Encounter (Signed)
Patient reports she is doing well and tolerating medications well. Patient reports that her fasting blood sugars are around 220's.

## 2016-10-05 ENCOUNTER — Encounter: Payer: Self-pay | Admitting: Dietician

## 2016-10-05 ENCOUNTER — Encounter: Payer: BLUE CROSS/BLUE SHIELD | Attending: Internal Medicine | Admitting: Dietician

## 2016-10-05 VITALS — BP 106/78 | Ht 63.0 in | Wt 245.0 lb

## 2016-10-05 DIAGNOSIS — E119 Type 2 diabetes mellitus without complications: Secondary | ICD-10-CM

## 2016-10-05 DIAGNOSIS — E1310 Other specified diabetes mellitus with ketoacidosis without coma: Secondary | ICD-10-CM | POA: Insufficient documentation

## 2016-10-05 DIAGNOSIS — Z713 Dietary counseling and surveillance: Secondary | ICD-10-CM | POA: Insufficient documentation

## 2016-10-05 NOTE — Progress Notes (Signed)
Diabetes Self-Management Education  Visit Type: First/Initial  Appt. Start Time:0900 Appt. End Time: 1000  10/05/2016  [name], identified by name and date of birth, is a 46 y.o. female with a diagnosis of Diabetes: Type 2.   ASSESSMENT  Blood pressure 106/78, height 5\' 3"  (1.6 m), weight 245 lb (111.1 kg). Body mass index is 43.4 kg/m.  Lacks knowledge of diabetes care Recent BG's 200's-occasional 300's c/o constant numbness fingers of left hand Obesity Recent A1C 14%      Diabetes Self-Management Education - 10/05/16 1516      Visit Information   Visit Type First/Initial     Initial Visit   Diabetes Type Type 2     Health Coping   How would you rate your overall health? Fair     Psychosocial Assessment   Patient Belief/Attitude about Diabetes Motivated to manage diabetes   Self-care barriers --  slightly blurred vision   Patient Concerns Medication;Glycemic Control;Weight Control;Healthy Lifestyle  prevent complications, become more fit   Special Needs None   Preferred Learning Style Auditory;Visual;Hands on   What is the last grade level you completed in school? 12 + some college     Pre-Education Assessment   Patient understands the diabetes disease and treatment process. Needs Instruction   Patient understands incorporating nutritional management into lifestyle. Needs Instruction   Patient understands using medications safely. Needs Instruction   Patient understands monitoring blood glucose, interpreting and using results Needs Review   Patient understands prevention, detection, and treatment of acute complications. Needs Instruction   Patient understands prevention, detection, and treatment of chronic complications. Needs Instruction   Patient understands how to develop strategies to address psychosocial issues. Needs Instruction   Patient understands how to develop strategies to promote health/change behavior. Needs Instruction     Complications   Last HgB A1C per patient/outside source 14 %   How often do you check your blood sugar? 1-2 times/day   Fasting Blood glucose range (mg/dL) >200   Postprandial Blood glucose range (mg/dL) >200   Have you had a dilated eye exam in the past 12 months? Yes  summer 2017   Have you had a dental exam in the past 12 months? Yes  04-2016   Are you checking your feet? No     Dietary Intake   Breakfast --  eats breakfast at 7-8a   Snack (morning) --  eats occasional snack at 10a=fruit or trail mix   Lunch --  eats lunch at 12-12:30p-varies carb intake at meals   Snack (afternoon) --  none   Dinner --  eats supper at [employer] (evening) --  none   Beverage(s) --  drinks water 2-3 glasses/day and diet drinks 2-3x/day + milk 1x/day     Exercise   Exercise Type ADL's     Patient Education   Previous Diabetes Education No   Disease state  Definition of diabetes, type 1 and 2, and the diagnosis of diabetes;Factors that contribute to the development of diabetes;Explored patient's options for treatment of their diabetes   Nutrition management  Role of diet in the treatment of diabetes and the relationship between the three main macronutrients and blood glucose level;Food label reading, portion sizes and measuring food.;Carbohydrate counting   Physical activity and exercise  Role of exercise on diabetes management, blood pressure control and cardiac health.;Helped patient identify appropriate exercises in relation to his/her diabetes, diabetes complications and other health issue.   Medications Taught/reviewed insulin injection, site  rotation, insulin storage and needle disposal.;Reviewed patients medication for diabetes, action, purpose, timing of dose and side effects.   Monitoring Purpose and frequency of SMBG.;Identified appropriate SMBG and/or A1C goals.;Taught/discussed recording of test results and interpretation of SMBG.  reviewed [place] of One Touch Ultra 2 meter   Acute complications Taught  treatment of hypoglycemia - the 15 rule.;Discussed and identified patients' treatment of hyperglycemia.   Chronic complications Relationship between chronic complications and blood glucose control;Lipid levels, blood glucose control and heart disease;Dental care;Retinopathy and reason for yearly dilated eye exams   Psychosocial adjustment Role of stress on diabetes   Personal strategies to promote health Lifestyle issues that need to be addressed for better diabetes care;Helped patient develop diabetes management plan for (enter comment)      Individualized Plan for Diabetes Self-Management Training:   Learning Objective:  Patient will have a greater understanding of diabetes self-management. Patient education plan is to attend individual and/or group sessions per assessed needs and concerns.   Plan:   Patient Instructions   Check blood sugars 2 x day before breakfast and 2 hrs after supper every day Exercise: Only exercise if BG's <250   Avoid sugar sweetened drinks (soda, tea, coffee, sports drinks, juices) Limit intake of sweets and fried foods Eat 3 meals day,   2  snacks a day in afternoon and at bedtime Include 2-3 carbohydrate servings/meal + protein Include 1 carbohydrate serving/snack + protein Space meals 4-5 hours apart Drink lots of water Complete 3 Day Food Record and bring to next appt Bring blood sugar records to the next appointment/class Get a Sharps container Carry fast acting glucose and a snack at all times Rotate injection sites Return for appointment/classes on:  10-18-16   Expected Outcomes:   positive  Education material provided: General Meal planning Guidelines, high and low BG handouts  If problems or questions, patient to contact team via:  [phone]  Future DSME appointment:  10-18-16

## 2016-10-05 NOTE — Progress Notes (Signed)
Unable to send initial letter to Paulita Cradle PA. Faxed letter to her.

## 2016-10-05 NOTE — Patient Instructions (Signed)
  Check blood sugars 2 x day before breakfast and 2 hrs after supper every day Exercise: Only exercise if BG's <250   Avoid sugar sweetened drinks (soda, tea, coffee, sports drinks, juices) Limit intake of sweets and fried foods Eat 3 meals day,   2  snacks a day in afternoon and at bedtime Include 2-3 carbohydrate servings/meal + protein Include 1 carbohydrate serving/snack + protein Space meals 4-5 hours apart Drink lots of water Complete 3 Day Food Record and bring to next appt Bring blood sugar records to the next appointment/class Get a Sharps container Carry fast acting glucose and a snack at all times Rotate injection sites Return for appointment/classes on:  10-18-16

## 2016-10-11 ENCOUNTER — Other Ambulatory Visit: Payer: Self-pay | Admitting: Physician Assistant

## 2016-10-11 DIAGNOSIS — Z1231 Encounter for screening mammogram for malignant neoplasm of breast: Secondary | ICD-10-CM

## 2016-10-12 ENCOUNTER — Other Ambulatory Visit: Payer: Self-pay | Admitting: Family Medicine

## 2016-10-12 DIAGNOSIS — I10 Essential (primary) hypertension: Secondary | ICD-10-CM

## 2016-10-13 NOTE — Telephone Encounter (Signed)
Please review-aa 

## 2016-10-18 ENCOUNTER — Encounter: Payer: BLUE CROSS/BLUE SHIELD | Attending: Internal Medicine | Admitting: Dietician

## 2016-10-18 ENCOUNTER — Ambulatory Visit: Payer: BLUE CROSS/BLUE SHIELD | Admitting: Family Medicine

## 2016-10-18 ENCOUNTER — Encounter: Payer: Self-pay | Admitting: Dietician

## 2016-10-18 ENCOUNTER — Ambulatory Visit: Payer: BLUE CROSS/BLUE SHIELD | Admitting: Physician Assistant

## 2016-10-18 VITALS — Ht 63.0 in | Wt 242.4 lb

## 2016-10-18 DIAGNOSIS — E1310 Other specified diabetes mellitus with ketoacidosis without coma: Secondary | ICD-10-CM | POA: Insufficient documentation

## 2016-10-18 DIAGNOSIS — E119 Type 2 diabetes mellitus without complications: Secondary | ICD-10-CM

## 2016-10-18 DIAGNOSIS — Z713 Dietary counseling and surveillance: Secondary | ICD-10-CM | POA: Insufficient documentation

## 2016-10-18 NOTE — Progress Notes (Signed)

## 2016-11-01 ENCOUNTER — Encounter: Payer: BLUE CROSS/BLUE SHIELD | Admitting: Dietician

## 2016-11-01 ENCOUNTER — Encounter: Payer: Self-pay | Admitting: Dietician

## 2016-11-01 VITALS — BP 110/78 | Ht 63.0 in | Wt 236.2 lb

## 2016-11-01 DIAGNOSIS — E119 Type 2 diabetes mellitus without complications: Secondary | ICD-10-CM

## 2016-11-01 DIAGNOSIS — Z713 Dietary counseling and surveillance: Secondary | ICD-10-CM | POA: Diagnosis not present

## 2016-11-01 NOTE — Progress Notes (Signed)

## 2016-11-03 DIAGNOSIS — E1165 Type 2 diabetes mellitus with hyperglycemia: Secondary | ICD-10-CM | POA: Diagnosis not present

## 2016-11-03 DIAGNOSIS — E785 Hyperlipidemia, unspecified: Secondary | ICD-10-CM | POA: Diagnosis not present

## 2016-11-08 ENCOUNTER — Other Ambulatory Visit: Payer: Self-pay

## 2016-11-08 MED ORDER — FLUTICASONE PROPIONATE 50 MCG/ACT NA SUSP: 2.0000 | Freq: Every day | NASAL | 3 refills | Status: DC

## 2016-11-08 NOTE — Telephone Encounter (Signed)
Refill request received from [facility] requesting a 90 day supply on fluticasone (FLONASE) 50 MCG/ACT nasal spray

## 2016-11-18 ENCOUNTER — Ambulatory Visit: Payer: BLUE CROSS/BLUE SHIELD

## 2016-11-18 ENCOUNTER — Other Ambulatory Visit: Payer: Self-pay

## 2016-11-18 DIAGNOSIS — F411 Generalized anxiety disorder: Secondary | ICD-10-CM

## 2016-11-18 DIAGNOSIS — F3342 Major depressive disorder, recurrent, in full remission: Secondary | ICD-10-CM

## 2016-11-18 MED ORDER — CITALOPRAM HYDROBROMIDE 20 MG PO TABS: 30.0000 mg | ORAL_TABLET | Freq: Every day | ORAL | 3 refills | Status: DC

## 2016-11-22 ENCOUNTER — Ambulatory Visit: Payer: BLUE CROSS/BLUE SHIELD

## 2016-11-23 ENCOUNTER — Ambulatory Visit
Admission: RE | Admit: 2016-11-23 | Discharge: 2016-11-23 | Disposition: A | Discharge status: Home / Self Care | Payer: BLUE CROSS/BLUE SHIELD | Source: Ambulatory Visit | Attending: Physician Assistant | Admitting: Physician Assistant

## 2016-11-23 DIAGNOSIS — Z1231 Encounter for screening mammogram for malignant neoplasm of breast: Secondary | ICD-10-CM

## 2016-11-29 ENCOUNTER — Encounter: Payer: Self-pay | Admitting: *Deleted

## 2016-11-29 ENCOUNTER — Encounter: Payer: BLUE CROSS/BLUE SHIELD | Attending: Internal Medicine | Admitting: *Deleted

## 2016-11-29 VITALS — Wt 230.3 lb

## 2016-11-29 DIAGNOSIS — Z713 Dietary counseling and surveillance: Secondary | ICD-10-CM | POA: Diagnosis present

## 2016-11-29 DIAGNOSIS — E1310 Other specified diabetes mellitus with ketoacidosis without coma: Secondary | ICD-10-CM | POA: Insufficient documentation

## 2016-11-29 DIAGNOSIS — E119 Type 2 diabetes mellitus without complications: Secondary | ICD-10-CM

## 2016-11-29 NOTE — Progress Notes (Signed)

## 2016-12-02 ENCOUNTER — Emergency Department: Payer: BLUE CROSS/BLUE SHIELD

## 2016-12-02 ENCOUNTER — Encounter: Payer: Self-pay | Admitting: Emergency Medicine

## 2016-12-02 ENCOUNTER — Emergency Department
Admission: EM | Admit: 2016-12-02 | Discharge: 2016-12-02 | Disposition: A | Discharge status: Home / Self Care | Payer: BLUE CROSS/BLUE SHIELD | Attending: Emergency Medicine | Admitting: Emergency Medicine

## 2016-12-02 DIAGNOSIS — Z79899 Other long term (current) drug therapy: Secondary | ICD-10-CM | POA: Insufficient documentation

## 2016-12-02 DIAGNOSIS — R1110 Vomiting, unspecified: Secondary | ICD-10-CM | POA: Diagnosis not present

## 2016-12-02 DIAGNOSIS — Z794 Long term (current) use of insulin: Secondary | ICD-10-CM | POA: Diagnosis not present

## 2016-12-02 DIAGNOSIS — E119 Type 2 diabetes mellitus without complications: Secondary | ICD-10-CM | POA: Insufficient documentation

## 2016-12-02 DIAGNOSIS — R112 Nausea with vomiting, unspecified: Secondary | ICD-10-CM | POA: Insufficient documentation

## 2016-12-02 DIAGNOSIS — I10 Essential (primary) hypertension: Secondary | ICD-10-CM | POA: Insufficient documentation

## 2016-12-02 DIAGNOSIS — R1013 Epigastric pain: Secondary | ICD-10-CM | POA: Diagnosis not present

## 2016-12-02 DIAGNOSIS — R109 Unspecified abdominal pain: Secondary | ICD-10-CM | POA: Diagnosis not present

## 2016-12-02 DIAGNOSIS — K760 Fatty (change of) liver, not elsewhere classified: Secondary | ICD-10-CM | POA: Diagnosis not present

## 2016-12-02 DIAGNOSIS — R1010 Upper abdominal pain, unspecified: Secondary | ICD-10-CM | POA: Diagnosis present

## 2016-12-02 HISTORY — DX: Type 2 diabetes mellitus without complications: E11.9

## 2016-12-02 LAB — COMPREHENSIVE METABOLIC PANEL
ALT: 13 U/L — ABNORMAL LOW (ref 14–54)
AST: 18 U/L (ref 15–41)
Albumin: 4 g/dL (ref 3.5–5.0)
Alkaline Phosphatase: 101 U/L (ref 38–126)
Anion gap: 9 (ref 5–15)
BILIRUBIN TOTAL: 0.9 mg/dL (ref 0.3–1.2)
BUN: 20 mg/dL (ref 6–20)
CO2: 21 mmol/L — ABNORMAL LOW (ref 22–32)
CREATININE: 0.67 mg/dL (ref 0.44–1.00)
Calcium: 9 mg/dL (ref 8.9–10.3)
Chloride: 105 mmol/L (ref 101–111)
GFR calc Af Amer: >60 mL/min (ref >60)
Glucose, Bld: 112 mg/dL — ABNORMAL HIGH (ref 65–99)
POTASSIUM: 3.2 mmol/L — AB (ref 3.5–5.1)
Sodium: 135 mmol/L (ref 135–145)
Total Protein: 7.7 g/dL (ref 6.5–8.1)

## 2016-12-02 LAB — URINALYSIS, COMPLETE (UACMP) WITH MICROSCOPIC
Bilirubin Urine: NEGATIVE
Glucose, UA: NEGATIVE mg/dL
Hgb urine dipstick: NEGATIVE
Ketones, ur: 20 mg/dL — AB
Leukocytes, UA: NEGATIVE
Nitrite: NEGATIVE
Protein, ur: 30 mg/dL — AB
Specific Gravity, Urine: 1.027 (ref 1.005–1.030)
pH: 5 (ref 5.0–8.0)

## 2016-12-02 LAB — CBC
HCT: 45.7 % (ref 35.0–47.0)
HEMOGLOBIN: 15.2 g/dL (ref 12.0–16.0)
MCH: 28.3 pg (ref 26.0–34.0)
MCHC: 33.3 g/dL (ref 32.0–36.0)
MCV: 84.8 fL (ref 80.0–100.0)
Platelets: 280 10*3/uL (ref 150–440)
RBC: 5.39 MIL/uL — AB (ref 3.80–5.20)
RDW: 13.8 % (ref 11.5–14.5)
WBC: 15 10*3/uL — AB (ref 3.6–11.0)

## 2016-12-02 LAB — LIPASE, BLOOD: Lipase: 35 U/L (ref 11–51)

## 2016-12-02 MED ORDER — ONDANSETRON 4 MG PO TBDP: 4.0000 mg | ORAL_TABLET | Freq: Three times a day (TID) | ORAL | 0 refills | Status: DC | PRN

## 2016-12-02 MED ORDER — SODIUM CHLORIDE 0.9 % IV SOLN
Freq: Once | INTRAVENOUS | Status: AC
  Administered 2016-12-02: 09:00:00 via INTRAVENOUS

## 2016-12-02 MED ORDER — MORPHINE SULFATE (PF) 4 MG/ML IV SOLN
4.0000 mg | Freq: Once | INTRAVENOUS | Status: AC
  Administered 2016-12-02: 4 mg via INTRAVENOUS
  Filled 2016-12-02: qty 1

## 2016-12-02 MED ORDER — FAMOTIDINE IN NACL 20-0.9 MG/50ML-% IV SOLN
20.0000 mg | Freq: Once | INTRAVENOUS | Status: AC
  Administered 2016-12-02: 20 mg via INTRAVENOUS
  Filled 2016-12-02: qty 50

## 2016-12-02 MED ORDER — OXYCODONE-ACETAMINOPHEN 5-325 MG PO TABS: 1.0000 | ORAL_TABLET | Freq: Four times a day (QID) | ORAL | 0 refills | Status: DC | PRN

## 2016-12-02 MED ORDER — ONDANSETRON HCL 4 MG/2ML IJ SOLN
4.0000 mg | Freq: Once | INTRAMUSCULAR | Status: AC
  Administered 2016-12-02: 4 mg via INTRAVENOUS
  Filled 2016-12-02: qty 2

## 2016-12-02 MED ORDER — IOPAMIDOL (ISOVUE-300) INJECTION 61%
100.0000 mL | Freq: Once | INTRAVENOUS | Status: AC | PRN
  Administered 2016-12-02: 100 mL via INTRAVENOUS

## 2016-12-02 MED ORDER — PANTOPRAZOLE SODIUM 40 MG PO TBEC: 40.0000 mg | DELAYED_RELEASE_TABLET | Freq: Every day | ORAL | 1 refills | Status: AC

## 2016-12-02 MED ORDER — PANTOPRAZOLE SODIUM 40 MG PO TBEC
40.0000 mg | DELAYED_RELEASE_TABLET | Freq: Once | ORAL | Status: AC
  Administered 2016-12-02: 40 mg via ORAL
  Filled 2016-12-02: qty 1

## 2016-12-02 MED ORDER — OXYCODONE-ACETAMINOPHEN 5-325 MG PO TABS
2.0000 | ORAL_TABLET | Freq: Once | ORAL | Status: AC
  Administered 2016-12-02: 2 via ORAL
  Filled 2016-12-02: qty 2

## 2016-12-02 MED ORDER — IOPAMIDOL (ISOVUE-300) INJECTION 61%
30.0000 mL | Freq: Once | INTRAVENOUS | Status: AC | PRN
  Administered 2016-12-02: 30 mL via ORAL

## 2016-12-02 NOTE — ED Triage Notes (Signed)
ABD pain x1 week , N/V

## 2016-12-02 NOTE — ED Provider Notes (Signed)
[facility] Emergency Department Provider Note        Time seen: ----------------------------------------- 8:20 AM on 12/02/2016 -----------------------------------------    I have reviewed the triage vital signs and the nursing notes.   HISTORY   Chief Complaint Abdominal Pain    HPI Tina Henderson is a 46 y.o. female who presents to the ED for abdominal pain. Patient describes abdominal pain for the last week with nausea and vomiting. Patient denies fevers, chills or other complaints. Patient states the pain seemed to get worse whenever she eats, she does report a history of a stomach ulcer but has not taken antacids in some time. Pain is currently a 6 out of 10 in the upper abdomen that she describes as pressure.   Past Medical History:  Diagnosis Date  . Diabetes mellitus without complication ([place])   . Hyperlipidemia   . Hypertension     Patient Active Problem List   Diagnosis Date Noted  . DKA (diabetic ketoacidosis) ([place]) 09/22/2016  . Hyponatremia 09/22/2016  . Acute renal insufficiency 09/22/2016  . Dehydration 09/22/2016  . Polycythemia 09/22/2016  . DKA (diabetic ketoacidoses) ([place]) 09/22/2016  . Type 2 diabetes mellitus without complication, without long-term current use of insulin ([place]) 05/20/2016  . Adaptation reaction 12/14/2014  . Anxiety disorder 12/14/2014  . Airway hyperreactivity 12/14/2014  . Body mass index of 60 or higher 12/14/2014  . History of chicken pox 12/14/2014  . Clinical depression 12/14/2014  . 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 Acid reflux 12/14/2014  . Hypercholesteremia 12/14/2014  . Cannot sleep 12/14/2014  . Burning or prickling sensation 12/14/2014  . Bilateral polycystic ovarian syndrome 12/14/2014  . Restless leg 12/14/2014  . Irregular bleeding 04/02/2010  . Auditory tube disorder 10/07/2009  . Avitaminosis D 01/30/2009  . Allergic rhinitis 01/11/2008  . Essential (primary)  hypertension 01/11/2008    Past Surgical History:  Procedure Laterality Date  . ABDOMINAL HYSTERECTOMY  01/04/2011   Due to Endometriosis  . APPENDECTOMY  2002  . LAPAROSCOPIC OVARIAN      Allergies Bee venom  Social History Social History  Substance Use Topics  . Smoking status: Never Smoker  . Smokeless tobacco: Never Used  . Alcohol use No    Review of Systems Constitutional: Negative for fever. Cardiovascular: Negative for chest pain. Respiratory: Negative for shortness of breath. Gastrointestinal: Positive for abdominal pain, nausea vomiting Genitourinary: Negative for dysuria. Musculoskeletal: Negative for back pain. Skin: Negative for rash. Neurological: Negative for headaches, focal weakness or numbness.  10-point ROS otherwise negative.  ____________________________________________   PHYSICAL EXAM:  VITAL SIGNS: ED Triage Vitals  Enc Vitals Group     BP 12/02/16 0759 105/71     Pulse Rate 12/02/16 0759 (!) 117     Resp 12/02/16 0759 18     Temp 12/02/16 0759 98 F (36.7 C)     Temp Source 12/02/16 0759 Oral     SpO2 12/02/16 0759 97 %     Weight 12/02/16 0800 230 lb (104.3 kg)     Height 12/02/16 0800 5\' 3"  (1.6 m)     Head Circumference --      Peak Flow --      Pain Score 12/02/16 0802 6     Pain Loc --      Pain Edu? --      Excl. in [place]? --     Constitutional: Alert and oriented. Mild distress, obese Eyes: Conjunctivae are normal. PERRL. Normal extraocular movements. ENT  Head: Normocephalic and atraumatic.   Nose: No congestion/rhinnorhea.   Mouth/Throat: Mucous membranes are moist.   Neck: No stridor. Cardiovascular: Normal rate, regular rhythm. No murmurs, rubs, or gallops. Respiratory: Normal respiratory effort without tachypnea nor retractions. Breath sounds are clear and equal bilaterally. No wheezes/rales/rhonchi. Gastrointestinal: Mild upper abdominal tenderness, questionable Murphy sign, normal bowel  sounds. Musculoskeletal: Nontender with normal range of motion in extremities. No lower extremity tenderness nor edema. Neurologic:  Normal speech and language. No gross focal neurologic deficits are appreciated.  Skin:  Skin is warm, dry and intact. No rash noted. Psychiatric: Mood and affect are normal. Speech and behavior are normal.  ___________________________________________  ED COURSE:  Pertinent labs & imaging results that were available during my care of the patient were reviewed by me and considered in my medical decision making (see chart for details). Patient presents for upper abdominal pain, we will assess with labs and imaging if needed. She will likely need ultrasound and basic labs.   Procedures ____________________________________________   LABS (pertinent positives/negatives)  Labs Reviewed  COMPREHENSIVE METABOLIC PANEL - Abnormal; Notable for the following:       Result Value   Potassium 3.2 (*)    CO2 21 (*)    Glucose, Bld 112 (*)    ALT 13 (*)    All other components within normal limits  CBC - Abnormal; Notable for the following:    WBC 15.0 (*)    RBC 5.39 (*)    All other components within normal limits  URINALYSIS, COMPLETE (UACMP) WITH MICROSCOPIC - Abnormal; Notable for the following:    Color, Urine YELLOW (*)    APPearance CLEAR (*)    Ketones, ur 20 (*)    Protein, ur 30 (*)    Bacteria, UA RARE (*)    Squamous Epithelial / LPF 0-5 (*)    All other components within normal limits  LIPASE, BLOOD    RADIOLOGY  Abdominal ultrasound IMPRESSION: Fatty infiltration of the liver.  Stable small gallbladder wall polyp.  No acute findings.  IMPRESSION: Mildly dilated small bowel loops throughout the abdomen bilaterally with scattered air-fluid levels. Findings are nonspecific and could represent mild adynamic ileus versus partial distal small bowel obstruction. Correlate with CT abdomen/pelvis with IV and oral contrast as clinically  warranted. ____________________________________________  FINAL ASSESSMENT AND PLAN  Abdominal pain, Nausea and vomiting  Plan: Patient's labs and imaging were dictated above. Patient had presented for abdominal pain. Initially I was thinking this would be related to gallbladder etiology but this does not appear to be the case. She does have a history of gastric ulcer so we will begin treating her for same. This may also be secondary to Victoza and I have advised if the pain persists after the next week that she will need to stop this medication. I discussed the CT findings with Dr. Dahlia Byes who suggests this is likely enteritis and she is stable for outpatient follow-up.   Earleen Newport, MD   Note: This note was generated in part or whole with voice recognition software. Voice recognition is usually quite accurate but there are transcription errors that can and very often do occur. I apologize for any typographical errors that were not detected and corrected.     Earleen Newport, MD 12/02/16 [phone]

## 2016-12-06 DIAGNOSIS — R197 Diarrhea, unspecified: Secondary | ICD-10-CM | POA: Diagnosis not present

## 2016-12-06 DIAGNOSIS — E118 Type 2 diabetes mellitus with unspecified complications: Secondary | ICD-10-CM | POA: Diagnosis not present

## 2016-12-06 DIAGNOSIS — R1084 Generalized abdominal pain: Secondary | ICD-10-CM | POA: Diagnosis not present

## 2016-12-13 ENCOUNTER — Encounter: Payer: Self-pay | Admitting: Dietician

## 2016-12-17 DIAGNOSIS — E1165 Type 2 diabetes mellitus with hyperglycemia: Secondary | ICD-10-CM | POA: Diagnosis not present

## 2016-12-17 LAB — HEMOGLOBIN A1C: HEMOGLOBIN A1C: 7

## 2016-12-17 LAB — MICROALBUMIN, URINE

## 2017-01-11 ENCOUNTER — Other Ambulatory Visit: Payer: Self-pay | Admitting: Family Medicine

## 2017-01-11 DIAGNOSIS — I10 Essential (primary) hypertension: Secondary | ICD-10-CM

## 2017-02-02 DIAGNOSIS — E119 Type 2 diabetes mellitus without complications: Secondary | ICD-10-CM | POA: Diagnosis not present

## 2017-03-31 ENCOUNTER — Encounter: Payer: Self-pay | Admitting: Family Medicine

## 2017-04-15 ENCOUNTER — Encounter: Payer: Self-pay | Admitting: Family Medicine

## 2017-04-19 ENCOUNTER — Other Ambulatory Visit: Payer: Self-pay | Admitting: Family Medicine

## 2017-04-19 DIAGNOSIS — I10 Essential (primary) hypertension: Secondary | ICD-10-CM

## 2017-05-01 ENCOUNTER — Other Ambulatory Visit: Payer: Self-pay | Admitting: Family Medicine

## 2017-05-01 DIAGNOSIS — I10 Essential (primary) hypertension: Secondary | ICD-10-CM

## 2017-05-13 DIAGNOSIS — R109 Unspecified abdominal pain: Secondary | ICD-10-CM | POA: Diagnosis not present

## 2017-06-01 DIAGNOSIS — E119 Type 2 diabetes mellitus without complications: Secondary | ICD-10-CM | POA: Diagnosis not present

## 2017-06-08 DIAGNOSIS — E1159 Type 2 diabetes mellitus with other circulatory complications: Secondary | ICD-10-CM | POA: Diagnosis not present

## 2017-06-08 DIAGNOSIS — E119 Type 2 diabetes mellitus without complications: Secondary | ICD-10-CM | POA: Diagnosis not present

## 2017-06-08 DIAGNOSIS — E1169 Type 2 diabetes mellitus with other specified complication: Secondary | ICD-10-CM | POA: Diagnosis not present

## 2017-06-08 DIAGNOSIS — I10 Essential (primary) hypertension: Secondary | ICD-10-CM | POA: Diagnosis not present

## 2017-06-15 DIAGNOSIS — E1159 Type 2 diabetes mellitus with other circulatory complications: Secondary | ICD-10-CM | POA: Diagnosis not present

## 2017-06-15 DIAGNOSIS — E119 Type 2 diabetes mellitus without complications: Secondary | ICD-10-CM | POA: Diagnosis not present

## 2017-06-15 DIAGNOSIS — Z0000 Encounter for general adult medical examination without abnormal findings: Secondary | ICD-10-CM | POA: Diagnosis not present

## 2017-06-15 DIAGNOSIS — E1169 Type 2 diabetes mellitus with other specified complication: Secondary | ICD-10-CM | POA: Diagnosis not present

## 2017-06-15 DIAGNOSIS — Z23 Encounter for immunization: Secondary | ICD-10-CM | POA: Diagnosis not present

## 2017-07-04 DIAGNOSIS — M5412 Radiculopathy, cervical region: Secondary | ICD-10-CM | POA: Diagnosis not present

## 2017-07-04 DIAGNOSIS — M47812 Spondylosis without myelopathy or radiculopathy, cervical region: Secondary | ICD-10-CM | POA: Diagnosis not present

## 2017-07-25 DIAGNOSIS — M542 Cervicalgia: Secondary | ICD-10-CM | POA: Diagnosis not present

## 2017-08-08 DIAGNOSIS — M542 Cervicalgia: Secondary | ICD-10-CM | POA: Diagnosis not present

## 2017-08-29 ENCOUNTER — Other Ambulatory Visit: Payer: Self-pay | Admitting: Family Medicine

## 2017-08-29 DIAGNOSIS — I10 Essential (primary) hypertension: Secondary | ICD-10-CM

## 2017-10-20 ENCOUNTER — Other Ambulatory Visit: Payer: Self-pay | Admitting: Physician Assistant

## 2017-10-20 DIAGNOSIS — F411 Generalized anxiety disorder: Secondary | ICD-10-CM

## 2017-10-20 DIAGNOSIS — F3342 Major depressive disorder, recurrent, in full remission: Secondary | ICD-10-CM

## 2017-10-20 NOTE — Telephone Encounter (Signed)
Patient needs appt before more refills, not seen since 09/2016

## 2017-11-02 ENCOUNTER — Other Ambulatory Visit: Payer: Self-pay | Admitting: Physician Assistant

## 2017-11-02 DIAGNOSIS — F3342 Major depressive disorder, recurrent, in full remission: Secondary | ICD-10-CM

## 2017-11-02 DIAGNOSIS — F411 Generalized anxiety disorder: Secondary | ICD-10-CM

## 2017-11-02 NOTE — Telephone Encounter (Signed)
Patient will need appt before more refills. If she has established primary care elsewhere they will have to fill Rx.

## 2017-11-12 ENCOUNTER — Other Ambulatory Visit: Payer: Self-pay | Admitting: Family Medicine

## 2017-11-17 ENCOUNTER — Other Ambulatory Visit: Payer: Self-pay | Admitting: Physician Assistant

## 2017-11-17 DIAGNOSIS — Z1231 Encounter for screening mammogram for malignant neoplasm of breast: Secondary | ICD-10-CM

## 2017-12-02 ENCOUNTER — Ambulatory Visit
Admission: RE | Admit: 2017-12-02 | Discharge: 2017-12-02 | Disposition: A | Discharge status: Home / Self Care | Payer: BLUE CROSS/BLUE SHIELD | Source: Ambulatory Visit | Attending: Physician Assistant | Admitting: Physician Assistant

## 2017-12-02 DIAGNOSIS — Z1231 Encounter for screening mammogram for malignant neoplasm of breast: Secondary | ICD-10-CM | POA: Diagnosis not present

## 2017-12-09 ENCOUNTER — Other Ambulatory Visit: Payer: Self-pay | Admitting: Physician Assistant

## 2017-12-09 DIAGNOSIS — R928 Other abnormal and inconclusive findings on diagnostic imaging of breast: Secondary | ICD-10-CM

## 2017-12-09 DIAGNOSIS — N6310 Unspecified lump in the right breast, unspecified quadrant: Secondary | ICD-10-CM

## 2017-12-20 ENCOUNTER — Ambulatory Visit
Admission: RE | Admit: 2017-12-20 | Discharge: 2017-12-20 | Disposition: A | Discharge status: Home / Self Care | Payer: BLUE CROSS/BLUE SHIELD | Source: Ambulatory Visit | Attending: Physician Assistant | Admitting: Physician Assistant

## 2017-12-20 DIAGNOSIS — N6310 Unspecified lump in the right breast, unspecified quadrant: Secondary | ICD-10-CM

## 2017-12-20 DIAGNOSIS — R928 Other abnormal and inconclusive findings on diagnostic imaging of breast: Secondary | ICD-10-CM | POA: Diagnosis not present

## 2018-01-13 DIAGNOSIS — E119 Type 2 diabetes mellitus without complications: Secondary | ICD-10-CM | POA: Diagnosis not present

## 2018-01-13 DIAGNOSIS — I10 Essential (primary) hypertension: Secondary | ICD-10-CM | POA: Diagnosis not present

## 2018-01-13 DIAGNOSIS — E1169 Type 2 diabetes mellitus with other specified complication: Secondary | ICD-10-CM | POA: Diagnosis not present

## 2018-01-13 DIAGNOSIS — E1159 Type 2 diabetes mellitus with other circulatory complications: Secondary | ICD-10-CM | POA: Diagnosis not present

## 2018-01-13 DIAGNOSIS — Z6841 Body Mass Index (BMI) 40.0 and over, adult: Secondary | ICD-10-CM | POA: Diagnosis not present

## 2018-01-13 DIAGNOSIS — E785 Hyperlipidemia, unspecified: Secondary | ICD-10-CM | POA: Diagnosis not present

## 2018-03-24 DIAGNOSIS — M545 Low back pain: Secondary | ICD-10-CM | POA: Diagnosis not present

## 2018-05-08 ENCOUNTER — Other Ambulatory Visit: Payer: Self-pay | Admitting: Family Medicine

## 2018-05-12 DIAGNOSIS — I10 Essential (primary) hypertension: Secondary | ICD-10-CM | POA: Diagnosis not present

## 2018-05-12 DIAGNOSIS — E1159 Type 2 diabetes mellitus with other circulatory complications: Secondary | ICD-10-CM | POA: Diagnosis not present

## 2018-05-12 DIAGNOSIS — E119 Type 2 diabetes mellitus without complications: Secondary | ICD-10-CM | POA: Diagnosis not present

## 2018-05-12 DIAGNOSIS — E1169 Type 2 diabetes mellitus with other specified complication: Secondary | ICD-10-CM | POA: Diagnosis not present

## 2018-07-07 DIAGNOSIS — S40022A Contusion of left upper arm, initial encounter: Secondary | ICD-10-CM | POA: Diagnosis not present

## 2018-07-07 DIAGNOSIS — E1169 Type 2 diabetes mellitus with other specified complication: Secondary | ICD-10-CM | POA: Diagnosis not present

## 2018-07-07 DIAGNOSIS — I10 Essential (primary) hypertension: Secondary | ICD-10-CM | POA: Diagnosis not present

## 2018-07-07 DIAGNOSIS — E119 Type 2 diabetes mellitus without complications: Secondary | ICD-10-CM | POA: Diagnosis not present

## 2018-07-07 DIAGNOSIS — S4992XA Unspecified injury of left shoulder and upper arm, initial encounter: Secondary | ICD-10-CM | POA: Diagnosis not present

## 2018-07-07 DIAGNOSIS — E1159 Type 2 diabetes mellitus with other circulatory complications: Secondary | ICD-10-CM | POA: Diagnosis not present

## 2018-07-07 DIAGNOSIS — E785 Hyperlipidemia, unspecified: Secondary | ICD-10-CM | POA: Diagnosis not present

## 2018-07-14 DIAGNOSIS — E119 Type 2 diabetes mellitus without complications: Secondary | ICD-10-CM | POA: Diagnosis not present

## 2018-07-14 DIAGNOSIS — Z23 Encounter for immunization: Secondary | ICD-10-CM | POA: Diagnosis not present

## 2018-07-14 DIAGNOSIS — E1169 Type 2 diabetes mellitus with other specified complication: Secondary | ICD-10-CM | POA: Diagnosis not present

## 2018-07-14 DIAGNOSIS — Z0000 Encounter for general adult medical examination without abnormal findings: Secondary | ICD-10-CM | POA: Diagnosis not present

## 2018-07-14 DIAGNOSIS — E1159 Type 2 diabetes mellitus with other circulatory complications: Secondary | ICD-10-CM | POA: Diagnosis not present

## 2018-07-20 DIAGNOSIS — M79642 Pain in left hand: Secondary | ICD-10-CM | POA: Diagnosis not present

## 2018-07-20 DIAGNOSIS — W1809XA Striking against other object with subsequent fall, initial encounter: Secondary | ICD-10-CM | POA: Diagnosis not present

## 2018-07-20 DIAGNOSIS — W19XXXA Unspecified fall, initial encounter: Secondary | ICD-10-CM | POA: Diagnosis not present

## 2018-07-20 DIAGNOSIS — M25532 Pain in left wrist: Secondary | ICD-10-CM | POA: Diagnosis not present

## 2018-07-20 DIAGNOSIS — M79602 Pain in left arm: Secondary | ICD-10-CM | POA: Diagnosis not present

## 2018-07-20 DIAGNOSIS — M25522 Pain in left elbow: Secondary | ICD-10-CM | POA: Diagnosis not present

## 2018-08-14 ENCOUNTER — Other Ambulatory Visit: Payer: Self-pay | Admitting: Family Medicine

## 2018-08-14 DIAGNOSIS — E119 Type 2 diabetes mellitus without complications: Secondary | ICD-10-CM | POA: Diagnosis not present

## 2018-08-14 DIAGNOSIS — Z6841 Body Mass Index (BMI) 40.0 and over, adult: Secondary | ICD-10-CM | POA: Diagnosis not present

## 2018-08-14 DIAGNOSIS — M79622 Pain in left upper arm: Secondary | ICD-10-CM | POA: Diagnosis not present

## 2018-08-14 DIAGNOSIS — G5602 Carpal tunnel syndrome, left upper limb: Secondary | ICD-10-CM | POA: Diagnosis not present

## 2019-01-17 DIAGNOSIS — E119 Type 2 diabetes mellitus without complications: Secondary | ICD-10-CM | POA: Diagnosis not present

## 2019-02-15 ENCOUNTER — Other Ambulatory Visit: Payer: Self-pay | Admitting: Physician Assistant

## 2019-02-15 DIAGNOSIS — Z1231 Encounter for screening mammogram for malignant neoplasm of breast: Secondary | ICD-10-CM

## 2019-03-30 ENCOUNTER — Ambulatory Visit
Admission: RE | Admit: 2019-03-30 | Discharge: 2019-03-30 | Disposition: A | Discharge status: Home / Self Care | Payer: BC Managed Care – PPO | Source: Ambulatory Visit | Attending: Physician Assistant | Admitting: Physician Assistant

## 2019-03-30 ENCOUNTER — Other Ambulatory Visit: Payer: Self-pay

## 2019-03-30 DIAGNOSIS — Z1231 Encounter for screening mammogram for malignant neoplasm of breast: Secondary | ICD-10-CM | POA: Insufficient documentation

## 2019-04-03 DIAGNOSIS — R200 Anesthesia of skin: Secondary | ICD-10-CM | POA: Diagnosis not present

## 2019-04-03 DIAGNOSIS — R202 Paresthesia of skin: Secondary | ICD-10-CM | POA: Diagnosis not present

## 2019-04-03 DIAGNOSIS — M79622 Pain in left upper arm: Secondary | ICD-10-CM | POA: Diagnosis not present

## 2019-04-23 DIAGNOSIS — R202 Paresthesia of skin: Secondary | ICD-10-CM | POA: Diagnosis not present

## 2019-04-23 DIAGNOSIS — R200 Anesthesia of skin: Secondary | ICD-10-CM | POA: Diagnosis not present

## 2019-04-23 DIAGNOSIS — M79622 Pain in left upper arm: Secondary | ICD-10-CM | POA: Diagnosis not present

## 2019-04-30 DIAGNOSIS — R200 Anesthesia of skin: Secondary | ICD-10-CM | POA: Diagnosis not present

## 2019-04-30 DIAGNOSIS — G5602 Carpal tunnel syndrome, left upper limb: Secondary | ICD-10-CM | POA: Diagnosis not present

## 2019-04-30 DIAGNOSIS — R202 Paresthesia of skin: Secondary | ICD-10-CM | POA: Diagnosis not present

## 2019-04-30 DIAGNOSIS — M79622 Pain in left upper arm: Secondary | ICD-10-CM | POA: Diagnosis not present

## 2019-05-16 DIAGNOSIS — R200 Anesthesia of skin: Secondary | ICD-10-CM | POA: Diagnosis not present

## 2019-05-16 DIAGNOSIS — M5412 Radiculopathy, cervical region: Secondary | ICD-10-CM | POA: Diagnosis not present

## 2019-05-16 DIAGNOSIS — M47812 Spondylosis without myelopathy or radiculopathy, cervical region: Secondary | ICD-10-CM | POA: Diagnosis not present

## 2019-05-16 DIAGNOSIS — G5602 Carpal tunnel syndrome, left upper limb: Secondary | ICD-10-CM | POA: Diagnosis not present

## 2019-05-17 ENCOUNTER — Other Ambulatory Visit: Payer: Self-pay | Admitting: Orthopedic Surgery

## 2019-05-17 DIAGNOSIS — G5602 Carpal tunnel syndrome, left upper limb: Secondary | ICD-10-CM

## 2019-05-17 DIAGNOSIS — M5412 Radiculopathy, cervical region: Secondary | ICD-10-CM

## 2019-05-17 DIAGNOSIS — R200 Anesthesia of skin: Secondary | ICD-10-CM

## 2019-05-17 DIAGNOSIS — R202 Paresthesia of skin: Secondary | ICD-10-CM

## 2019-05-17 DIAGNOSIS — M47812 Spondylosis without myelopathy or radiculopathy, cervical region: Secondary | ICD-10-CM

## 2019-05-29 ENCOUNTER — Ambulatory Visit
Admission: RE | Admit: 2019-05-29 | Discharge: 2019-05-29 | Disposition: A | Discharge status: Home / Self Care | Payer: BC Managed Care – PPO | Source: Ambulatory Visit | Attending: Orthopedic Surgery | Admitting: Orthopedic Surgery

## 2019-05-29 ENCOUNTER — Other Ambulatory Visit: Payer: Self-pay

## 2019-05-29 DIAGNOSIS — R200 Anesthesia of skin: Secondary | ICD-10-CM | POA: Insufficient documentation

## 2019-05-29 DIAGNOSIS — M5412 Radiculopathy, cervical region: Secondary | ICD-10-CM | POA: Insufficient documentation

## 2019-05-29 DIAGNOSIS — M47812 Spondylosis without myelopathy or radiculopathy, cervical region: Secondary | ICD-10-CM | POA: Insufficient documentation

## 2019-05-29 DIAGNOSIS — M4802 Spinal stenosis, cervical region: Secondary | ICD-10-CM | POA: Diagnosis not present

## 2019-05-29 DIAGNOSIS — R202 Paresthesia of skin: Secondary | ICD-10-CM | POA: Diagnosis not present

## 2019-05-29 DIAGNOSIS — G5602 Carpal tunnel syndrome, left upper limb: Secondary | ICD-10-CM | POA: Diagnosis not present

## 2019-05-29 DIAGNOSIS — M2578 Osteophyte, vertebrae: Secondary | ICD-10-CM | POA: Diagnosis not present

## 2019-07-12 DIAGNOSIS — E785 Hyperlipidemia, unspecified: Secondary | ICD-10-CM | POA: Diagnosis not present

## 2019-07-12 DIAGNOSIS — I10 Essential (primary) hypertension: Secondary | ICD-10-CM | POA: Diagnosis not present

## 2019-07-12 DIAGNOSIS — E119 Type 2 diabetes mellitus without complications: Secondary | ICD-10-CM | POA: Diagnosis not present

## 2019-07-12 DIAGNOSIS — E1159 Type 2 diabetes mellitus with other circulatory complications: Secondary | ICD-10-CM | POA: Diagnosis not present

## 2019-07-12 DIAGNOSIS — E1169 Type 2 diabetes mellitus with other specified complication: Secondary | ICD-10-CM | POA: Diagnosis not present

## 2019-07-19 DIAGNOSIS — R109 Unspecified abdominal pain: Secondary | ICD-10-CM | POA: Diagnosis not present

## 2019-07-19 DIAGNOSIS — E119 Type 2 diabetes mellitus without complications: Secondary | ICD-10-CM | POA: Diagnosis not present

## 2019-07-19 DIAGNOSIS — Z23 Encounter for immunization: Secondary | ICD-10-CM | POA: Diagnosis not present

## 2019-07-19 DIAGNOSIS — Z0000 Encounter for general adult medical examination without abnormal findings: Secondary | ICD-10-CM | POA: Diagnosis not present

## 2019-10-02 IMAGING — MG MM DIGITAL DIAGNOSTIC UNILAT*R* W/ TOMO W/ CAD
6 series · 6 of 14 positions shown · non-contrast
Comparison: 12/02/2017 and earlier

CLINICAL DATA: Patient returns after screening study for evaluation
of possible RIGHT breast mass.

EXAM:
DIGITAL DIAGNOSTIC UNILATERAL RIGHT MAMMOGRAM WITH CAD AND TOMO

[R CC synth-2D]
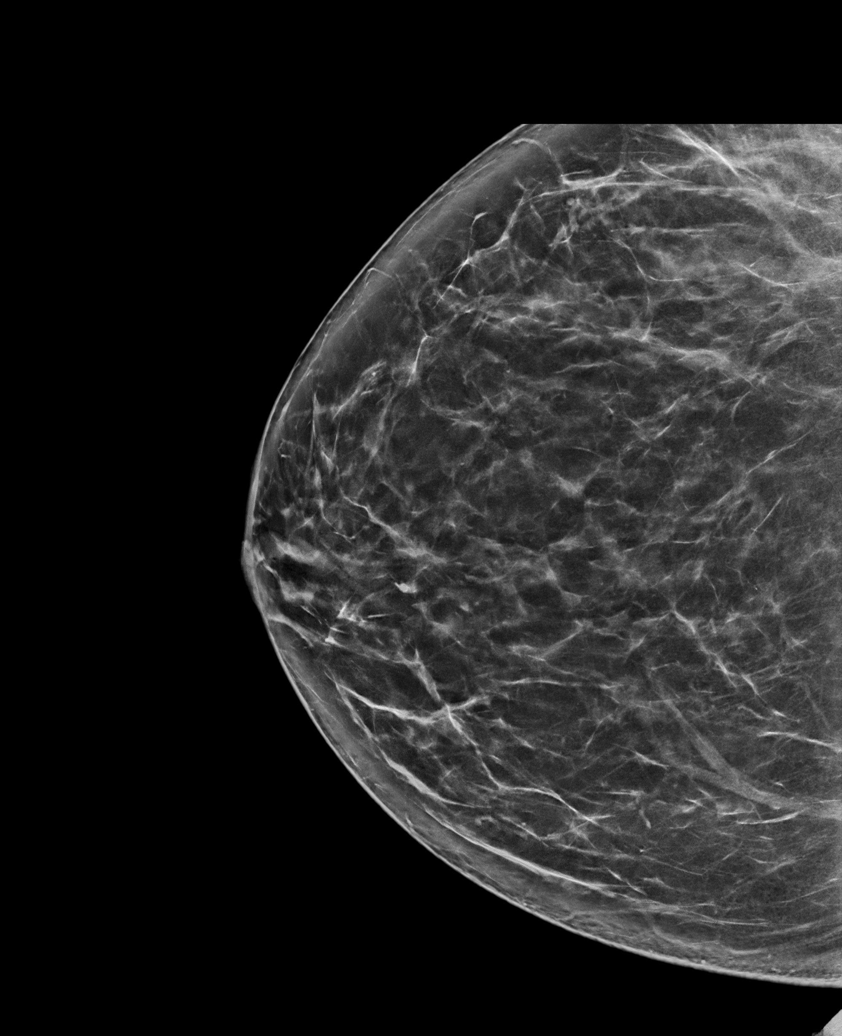

[R CC]
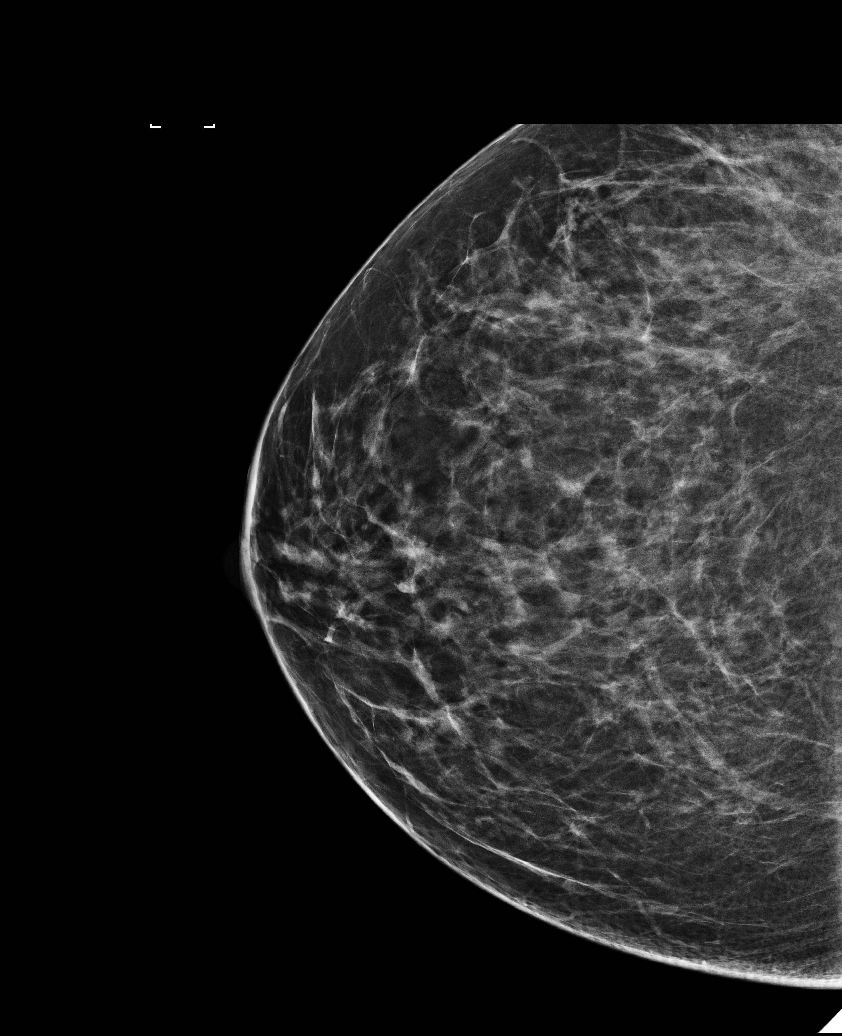

[R MLO]
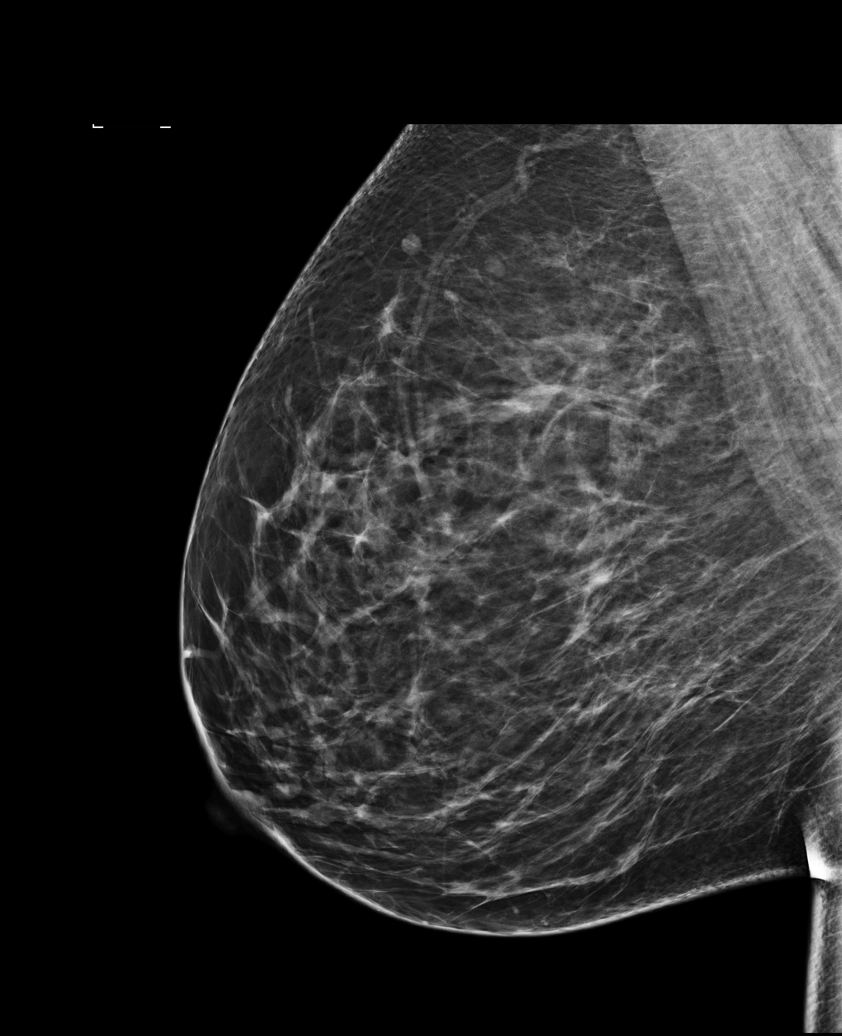

[R MLO synth-2D]
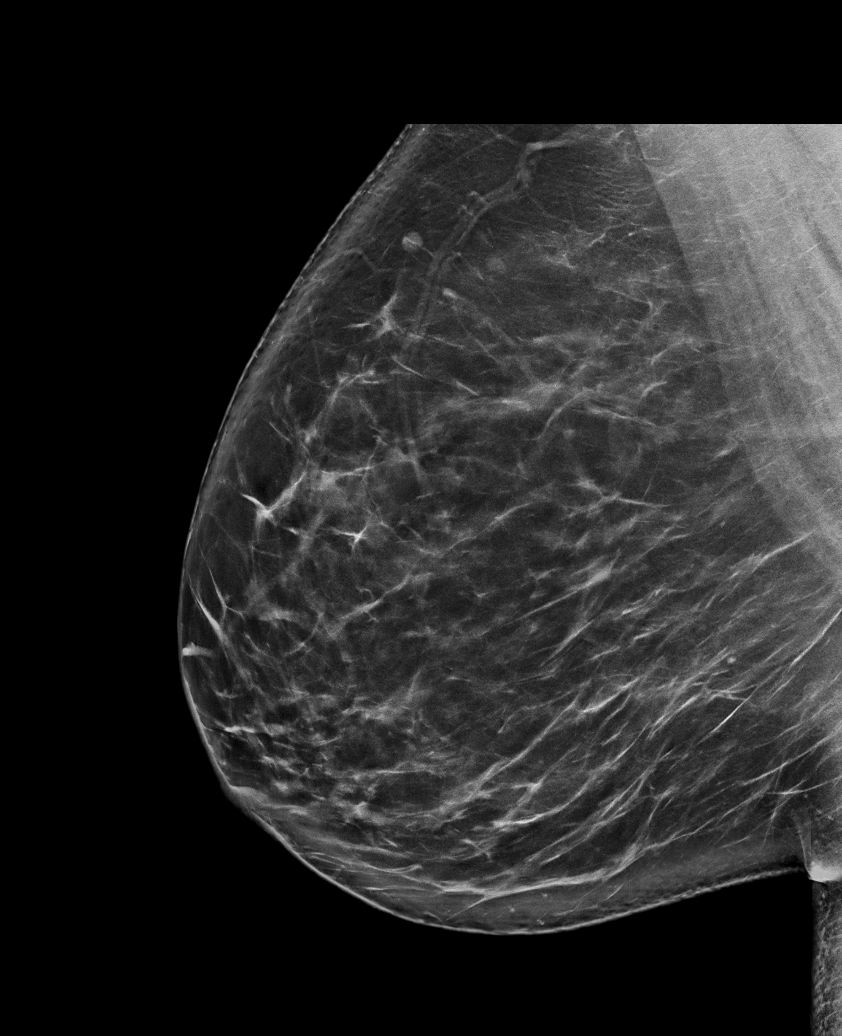

[R CC tomo · tomo slice 46/91.0]
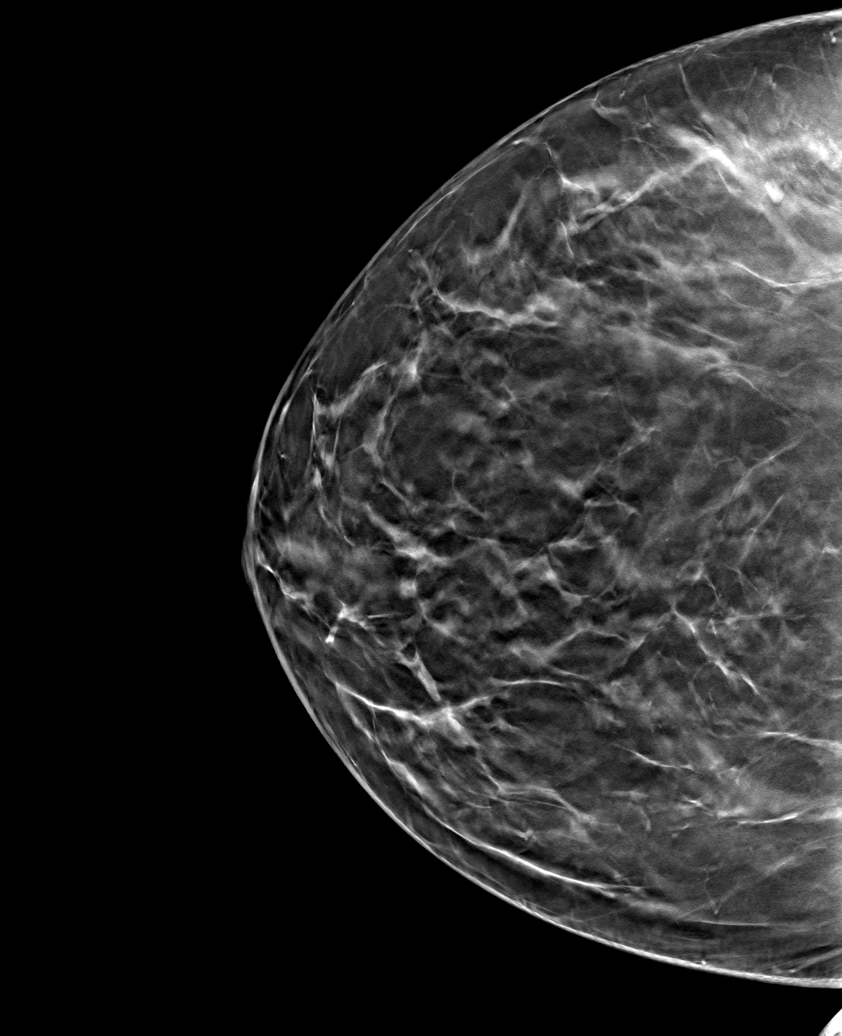

[R MLO tomo · tomo slice 53/106.0]
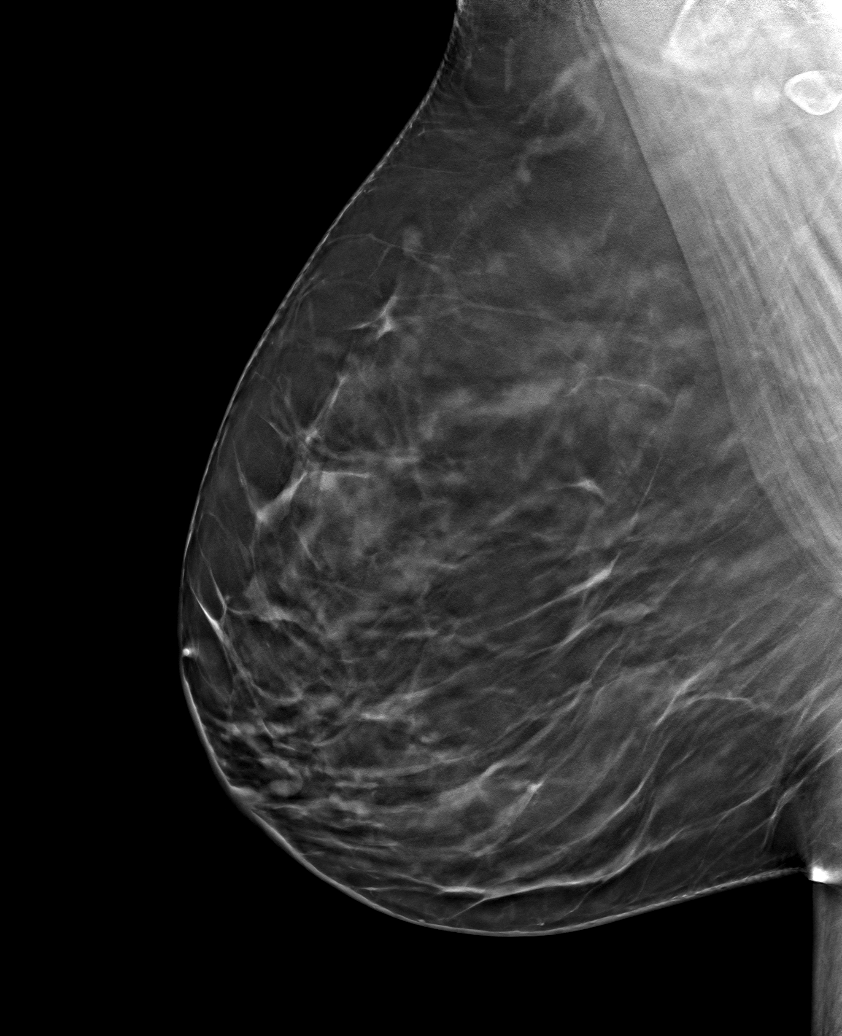

[6 of 14 positions shown; findings below may reference images not displayed]

ACR Breast Density Category b: There are scattered areas of
fibroglandular density.
FINDINGS: Additional 2-D and 3-D images are performed. No persistent
abnormality is identified in the MEDIAL aspect of the RIGHT breast.
No suspicious mass, distortion, or microcalcifications are
identified to suggest presence of malignancy.

Mammographic images were processed with CAD.
IMPRESSION: No mammographic evidence for malignancy.

RECOMMENDATION:
Screening mammogram in one year.(Code:DZ-[id number]-R1Y)

I have discussed the findings and recommendations with the patient.
Results were also provided in writing at the conclusion of the
visit. If applicable, a reminder letter will be sent to the patient
regarding the next appointment.

BI-RADS CATEGORY  1: Negative.

## 2020-03-26 ENCOUNTER — Other Ambulatory Visit: Payer: Self-pay | Admitting: Physician Assistant

## 2020-03-26 DIAGNOSIS — Z1231 Encounter for screening mammogram for malignant neoplasm of breast: Secondary | ICD-10-CM

## 2020-04-08 ENCOUNTER — Ambulatory Visit
Admission: RE | Admit: 2020-04-08 | Discharge: 2020-04-08 | Disposition: A | Discharge status: Home / Self Care | Payer: BC Managed Care – PPO | Source: Ambulatory Visit | Attending: Physician Assistant | Admitting: Physician Assistant

## 2020-04-08 DIAGNOSIS — Z1231 Encounter for screening mammogram for malignant neoplasm of breast: Secondary | ICD-10-CM | POA: Insufficient documentation

## 2021-01-20 ENCOUNTER — Encounter: Payer: Self-pay | Admitting: Physical Therapy

## 2021-01-20 ENCOUNTER — Ambulatory Visit: Payer: BC Managed Care – PPO | Attending: Physical Medicine & Rehabilitation | Admitting: Physical Therapy

## 2021-01-20 ENCOUNTER — Other Ambulatory Visit: Payer: Self-pay

## 2021-01-20 DIAGNOSIS — M6281 Muscle weakness (generalized): Secondary | ICD-10-CM | POA: Diagnosis present

## 2021-01-20 DIAGNOSIS — M542 Cervicalgia: Secondary | ICD-10-CM | POA: Diagnosis not present

## 2021-01-20 NOTE — Therapy (Signed)
[facility] [address] [place], [place], [address] Phone: [phone]   Fax:  [phone]  Physical Therapy Evaluation  Patient Details  Name: Tina Henderson MRN: [id number] Date of Birth: 01-06-1971 Referring Provider (PT): Girtha Hake   Encounter Date: 01/20/2021   PT End of Session - 01/20/21 1620    Visit Number 1    Number of Visits 12    Date for PT Re-Evaluation 03/03/21    Authorization Type IE 01/20/2021    PT Start Time 1630    PT Stop Time 1710    PT Time Calculation (min) 40 min    Activity Tolerance Patient tolerated treatment well    Behavior During Therapy [facility] for tasks assessed/performed           Past Medical History:  Diagnosis Date  . Diabetes mellitus without complication ([place])   . Hyperlipidemia   . Hypertension     Past Surgical History:  Procedure Laterality Date  . ABDOMINAL HYSTERECTOMY  01/04/2011   Due to Endometriosis  . APPENDECTOMY  2002  . LAPAROSCOPIC OVARIAN      There were no vitals filed for this visit.        [facility] PT Assessment - 01/20/21 1619      Assessment   Medical Diagnosis cervical radiculitis, foraminal stenosis of cervical region (C5-C7)    Referring Provider (PT) Girtha Hake    Hand Dominance Right    Prior Therapy None for this dx      Balance Screen   Has the patient fallen in the past 6 months No          SUBJECTIVE Chief complaint:  Patient notes that she has started on gabapentin which has managed the pain well, but she does continue to have tingling and numbness sporadically throughout the day. Patient also notes some swelling in the L hand which limits wearing rings and watches. Numbness and tingling occurs most in the first 3 fingers and patient feels that her watch can exacerbate her numbness/tingling.  Patient adds that she feels she has lost strength in the LUE, but as this is not her dominant hand she has not had the opportunity to  notice any profound loss of grip strength.   Patient has been dealing with this complaint for a couple years. Patient has had   Pain location: LUE Pain: Present 0/10, Best 0/10, Worst 7/10: tingling Pain quality: pain quality: numbness and tingling Radiating pain: Yes  Numbness/Tingling: Yes  24 hour pain behavior: AM depending on sleeping position Aggravating factors: laying on the left side Easing factors: ice/heat; gabapentin   Imaging: Yes   Occupational demands: payroll, computer desk job  Current activities: reading, watch tv, crocheting  Patient stated goals: "I hope the tingling can go away"    SCREENING Red Flags: None Have you had any increased drowsiness? Unexplained nausea and vomiting? Changes to vision, dizziness, speech? Any trouble swallowing food/beverage? Unexplained changes in bowel or bladder habits?  Mental Status Patient is oriented to person, place and time.  Recent memory is intact.  Remote memory is intact.  Attention span and concentration are intact.  Expressive speech is intact.  Patient's fund of knowledge is within normal limits for educational level.  POSTURE/OBSERVATIONS:  Cervical lordosis: diminished Thoracic kyphosis: mildly increased with forward shoulders Shoulder height: R shoulder slightly elevated Torticollis: negative  RANGE OF MOTION:    LEFT RIGHT  Cervical forward flexion:  19    Cervical extension:  22    Cervical lateral flexion:  30 31   Cervical rotation:    45 45  Shoulder Flexion:   WNL WNL  Shoulder IR:  WNL WNL  Shoulder ER:  WNL WNL  Shoulder Abduction:  WNL WNL    SENSATION: Grossly intact to light touch bilateral LEs as determined by testing dermatomes C2-T1. Patient notes mildy decrease sensation over LUE C6 dermatome. Proprioception and hot/cold testing deferred on this date.   STRENGTH: MMT   RUE LUE  Shoulder Elevation 5 5  Shoulder Flexion 5 3+  Shoulder  Extension 5 5  Shoulder Abduction  5  5  Shoulder Adduction  5 5  Shoulder ER  5 5  Shoulder IR  5 5  Elbow Extension 5 5  Elbow Flexion 5 5  Wrist Flexion  5 5  Wrist Extension  5 5  Thumb Extension 5 5  Lumbricals 5 5   REPEATED MOVEMENTS: Deferred 2/2 to time constraints   MUSCLE LENGTH: Deferred 2/2 to time constraints  PASSIVE ACCESSORY INTERVERTEBRAL MOTION (PAIVM) Deferred 2/2 to time constraints  SPECIAL TESTS ULTT Median: R: Negative L:  Positive ULTT Ulnar: R: Negative L: Negative ULTT Radial: R: Negative L: Negative Bakody's Sign: R: Negative L: Positive    ASSESSMENT Patient is a 50 year old presenting to clinic with chief complaints of LUE numbness and tingling. Upon examination, patient demonstrates deficits in cervical flexion and extension (19 and 22 degrees respectively), LUE shoulder flexion strength (3+/5 MMT), and positive ULTT median nerve bias on LUE. Patient's responses on FOTO outcome measures (61) indicate moderate functional limitations/disability/distress. Patient's progress may be limited due to persistence of complaint; however, patient's motivation and symptom presentation are advantageous. Patient was able to achieve symptom relief with gentle median nerve glides during today's evaluation and responded positively to active and educational interventions. Patient will benefit from continued skilled therapeutic intervention to address deficits in posture, strength, ROM, and neural mobility in order to increase function and improve overall QOL.  EDUCATION Patient educated on prognosis, POC, and provided with HEP including: L UT stretch and L median nerve glides. Patient articulated understanding and returned demonstration. Patient will benefit from further education in order to maximize compliance and understanding for long-term therapeutic gains.    Objective measurements completed on examination: See above findings.       PT Long Term Goals - 01/20/21 1729      PT LONG TERM GOAL #1    Title Patient will be independent with HEP in order to decrease ankle pain and increase strength in order to improve pain-free function at home and work.    Baseline IE: provided    Time 6    Period Weeks    Status New    Target Date 03/03/21      PT LONG TERM GOAL #2   Title Patient will decrease worst LUE tingling/numbness as reported on 0-10 scale (0 being no numbness and 10 being worst) by at least 2 points in order to demonstrate clinically significant reduction in neck pain.    Baseline IE: 7/10    Time 6    Period Weeks    Status New    Target Date 03/03/21      PT LONG TERM GOAL #3   Title Patient will be report "no difficulty" or "little difficulty" with placing a heavy object overhead and performing recreational activities including crocheting for improved participation at home and in the community.    Baseline IE: Quite  a bit/moderate    Time 6    Period Weeks    Status New    Target Date 03/03/21      PT LONG TERM GOAL #4   Title Patient will demonstrate improved function as evidenced by a score of 67 on FOTO measure for full participation in activities at home and in the community.    Baseline IE: 61    Time 6    Period Weeks    Status New    Target Date 03/03/21      PT LONG TERM GOAL #5   Title Patient will demonstrate improved LUE shoulder flexion strength by at least 1/2 MMT grade for improved function at home and in the community.    Baseline IE: 3+/5    Time 6    Period Weeks    Status New    Target Date 03/03/21                  Plan - 01/20/21 1621    Clinical Impression Statement Patient is a 50 year old presenting to clinic with chief complaints of LUE numbness and tingling. Upon examination, patient demonstrates deficits in cervical flexion and extension (19 and 22 degrees respectively), LUE shoulder flexion strength (3+/5 MMT), and positive ULTT median nerve bias on LUE. Patient's responses on FOTO outcome measures (61) indicate moderate  functional limitations/disability/distress. Patient's progress may be limited due to persistence of complaint; however, patient's motivation and symptom presentation are advantageous. Patient was able to achieve symptom relief with gentle median nerve glides during today's evaluation and responded positively to active and educational interventions. Patient will benefit from continued skilled therapeutic intervention to address deficits in posture, strength, ROM, and neural mobility in order to increase function and improve overall QOL.    Personal Factors and Comorbidities Age;Comorbidity 3+;Past/Current Experience;Time since onset of injury/illness/exacerbation    Comorbidities clinical depression, HTN, anxiety, acid reflux, DM, hypercholesteremia,    Examination-Activity Limitations Lift;Reach Overhead    Examination-Participation Restrictions Occupation;Driving;Meal Prep;Shop;Laundry;Yard Work    [employer] Evolving/Moderate complexity    Clinical Decision Making Moderate    Rehab Potential Fair    PT Frequency 2x / week    PT Duration 6 weeks    PT Treatment/Interventions ADLs/Self Care Home Management;Aquatic Therapy;Cryotherapy;Electrical Stimulation;Moist Heat;Therapeutic exercise;Neuromuscular re-education;Patient/family education;Orthotic Fit/Training;Manual techniques;Passive range of motion;Dry needling;Taping;Spinal Manipulations;Joint Manipulations    PT Next Visit Plan manual as needed, gentle shoulder strengthening and postural re-ed    PT Home Exercise Plan L UT stretch and L median nerve glides.    Consulted and Agree with Plan of Care Patient           Patient will benefit from skilled therapeutic intervention in order to improve the following deficits and impairments:  Postural dysfunction,Pain,Improper body mechanics,Decreased strength,Decreased activity tolerance,Impaired UE functional use,Decreased range of motion,Hypomobility  Visit  Diagnosis: Cervicalgia  Muscle weakness (generalized)     Problem List Patient Active Problem List   Diagnosis Date Noted  . DKA (diabetic ketoacidosis) ([place]) 09/22/2016  . Hyponatremia 09/22/2016  . Acute renal insufficiency 09/22/2016  . Dehydration 09/22/2016  . Polycythemia 09/22/2016  . DKA (diabetic ketoacidoses) 09/22/2016  . Type 2 diabetes mellitus without complication, without long-term current use of insulin ([place]) 05/20/2016  . Adaptation reaction 12/14/2014  . Anxiety disorder 12/14/2014  . Airway hyperreactivity 12/14/2014  . Body mass index of 60 or higher 12/14/2014  . History of chicken pox 12/14/2014  . Clinical depression 12/14/2014  . 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 Acid reflux 12/14/2014  . Hypercholesteremia 12/14/2014  . Cannot sleep 12/14/2014  . Burning or prickling sensation 12/14/2014  . Bilateral polycystic ovarian syndrome 12/14/2014  . Restless leg 12/14/2014  . Irregular bleeding 04/02/2010  . Auditory tube disorder 10/07/2009  . Avitaminosis D 01/30/2009  . Allergic rhinitis 01/11/2008  . Essential (primary) hypertension 01/11/2008   Myles Gip PT, DPT [phone]  01/20/2021, 5:36 PM  [facility] [address] [place], [place], [address] Phone: [phone]   Fax:  [phone]  Name: Tina Henderson MRN: [id number] Date of Birth: August 10, 1971

## 2021-01-22 ENCOUNTER — Ambulatory Visit: Payer: BC Managed Care – PPO | Admitting: Physical Therapy

## 2021-01-22 ENCOUNTER — Encounter: Payer: Self-pay | Admitting: Physical Therapy

## 2021-01-22 ENCOUNTER — Other Ambulatory Visit: Payer: Self-pay

## 2021-01-22 DIAGNOSIS — M542 Cervicalgia: Secondary | ICD-10-CM | POA: Diagnosis not present

## 2021-01-22 DIAGNOSIS — M6281 Muscle weakness (generalized): Secondary | ICD-10-CM

## 2021-01-22 NOTE — Therapy (Signed)
[facility] [address] [place], [place], [address] Phone: [phone]   Fax:  [phone]  Physical Therapy Treatment  Patient Details  Name: Tina Henderson MRN: [id number] Date of Birth: 04-18-1971 Referring Provider (PT): Girtha Hake   Encounter Date: 01/22/2021   PT End of Session - 01/22/21 1638    Visit Number 2    Number of Visits 12    Date for PT Re-Evaluation 03/03/21    Authorization Type IE 01/20/2021    PT Start Time 1630    PT Stop Time 1710    PT Time Calculation (min) 40 min    Activity Tolerance Patient tolerated treatment well    Behavior During Therapy [facility] for tasks assessed/performed           Past Medical History:  Diagnosis Date  . Diabetes mellitus without complication ([place])   . Hyperlipidemia   . Hypertension     Past Surgical History:  Procedure Laterality Date  . ABDOMINAL HYSTERECTOMY  01/04/2011   Due to Endometriosis  . APPENDECTOMY  2002  . LAPAROSCOPIC OVARIAN      There were no vitals filed for this visit.   Subjective Assessment - 01/22/21 1636    Subjective Patient notes that after evaluation she felt okay but the day following she was a little sore. Patient has been doing median nerve glides but has not noticed any profound impact yet.    Currently in Pain? Yes    Pain Score 2     Pain Location Hand    Pain Orientation Left    Pain Descriptors / Indicators Numbness          TREATMENT  Manual Therapy: STM and TPR performed to cervical paraspinal mm and L deltoid complex to allow for decreased tension and pain and improved posture and function Cervical Mobilizations with rotational movement for improved mobility, grade II/III Cervical extension CPAs, grade II/III for improved joint mobility Suboccipital release with manual distraction, multiple bouts, for decreased pain and spasm  Neuromuscular Re-education: Doorway stretch with arms at 60 degrees, for improved  posture and neural mobility Seated cervical retraction for improved posture and neural mobility Seated thoracic extension over ball for improved posture and neural mobility   Patient educated throughout session on appropriate technique and form using multi-modal cueing, HEP, and activity modification. Patient articulated understanding and returned demonstration.  Patient Response to interventions: Does not report increased pain.  ASSESSMENT Patient presents to clinic with excellent motivation to participate in therapy. Patient demonstrates deficits in cervical ROM, neural mobility, posture, and LUE strength. Patient able to achieve anterior chain opening stretch sequence without aggravation during today's session and responded positively to manual interventions. Patient will benefit from continued skilled therapeutic intervention to address remaining deficits in cervical ROM, neural mobility, posture, and LUE strength in order to increase function and improve overall QOL.     PT Long Term Goals - 01/20/21 1729      PT LONG TERM GOAL #1   Title Patient will be independent with HEP in order to decrease ankle pain and increase strength in order to improve pain-free function at home and work.    Baseline IE: provided    Time 6    Period Weeks    Status New    Target Date 03/03/21      PT LONG TERM GOAL #2   Title Patient will decrease worst LUE tingling/numbness as reported on 0-10 scale (0 being no numbness and  10 being worst) by at least 2 points in order to demonstrate clinically significant reduction in neck pain.    Baseline IE: 7/10    Time 6    Period Weeks    Status New    Target Date 03/03/21      PT LONG TERM GOAL #3   Title Patient will be report "no difficulty" or "little difficulty" with placing a heavy object overhead and performing recreational activities including crocheting for improved participation at home and in the community.    Baseline IE: Quite a bit/moderate     Time 6    Period Weeks    Status New    Target Date 03/03/21      PT LONG TERM GOAL #4   Title Patient will demonstrate improved function as evidenced by a score of 67 on FOTO measure for full participation in activities at home and in the community.    Baseline IE: 61    Time 6    Period Weeks    Status New    Target Date 03/03/21      PT LONG TERM GOAL #5   Title Patient will demonstrate improved LUE shoulder flexion strength by at least 1/2 MMT grade for improved function at home and in the community.    Baseline IE: 3+/5    Time 6    Period Weeks    Status New    Target Date 03/03/21                 Plan - 01/22/21 1638    Clinical Impression Statement Patient presents to clinic with excellent motivation to participate in therapy. Patient demonstrates deficits in cervical ROM, neural mobility, posture, and LUE strength. Patient able to achieve anterior chain opening stretch sequence without aggravation during today's session and responded positively to manual interventions. Patient will benefit from continued skilled therapeutic intervention to address remaining deficits in cervical ROM, neural mobility, posture, and LUE strength in order to increase function and improve overall QOL.    Personal Factors and Comorbidities Age;Comorbidity 3+;Past/Current Experience;Time since onset of injury/illness/exacerbation    Comorbidities clinical depression, HTN, anxiety, acid reflux, DM, hypercholesteremia,    Examination-Activity Limitations Lift;Reach Overhead    Examination-Participation Restrictions Occupation;Driving;Meal Prep;Shop;Laundry;Yard Work    [employer] officer Evolving/Moderate complexity    Rehab Potential Fair    PT Frequency 2x / week    PT Duration 6 weeks    PT Treatment/Interventions ADLs/Self Care Home Management;Aquatic Therapy;Cryotherapy;Electrical Stimulation;Moist Heat;Therapeutic exercise;Neuromuscular re-education;Patient/family  education;Orthotic Fit/Training;Manual techniques;Passive range of motion;Dry needling;Taping;Spinal Manipulations;Joint Manipulations    PT Next Visit Plan manual as needed, gentle shoulder strengthening and postural re-ed    PT Home Exercise Plan L UT stretch and L median nerve glides.    Consulted and Agree with Plan of Care Patient           Patient will benefit from skilled therapeutic intervention in order to improve the following deficits and impairments:  Postural dysfunction,Pain,Improper body mechanics,Decreased strength,Decreased activity tolerance,Impaired UE functional use,Decreased range of motion,Hypomobility  Visit Diagnosis: Cervicalgia  Muscle weakness (generalized)     Problem List Patient Active Problem List   Diagnosis Date Noted  . DKA (diabetic ketoacidosis) ([place]) 09/22/2016  . Hyponatremia 09/22/2016  . Acute renal insufficiency 09/22/2016  . Dehydration 09/22/2016  . Polycythemia 09/22/2016  . DKA (diabetic ketoacidoses) 09/22/2016  . Type 2 diabetes mellitus without complication, without long-term current use of insulin ([place]) 05/20/2016  . Adaptation reaction 12/14/2014  . Anxiety  disorder 12/14/2014  . Airway hyperreactivity 12/14/2014  . Body mass index of 60 or higher 12/14/2014  . History of chicken pox 12/14/2014  . Clinical depression 12/14/2014  . 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 Acid reflux 12/14/2014  . Hypercholesteremia 12/14/2014  . Cannot sleep 12/14/2014  . Burning or prickling sensation 12/14/2014  . Bilateral polycystic ovarian syndrome 12/14/2014  . Restless leg 12/14/2014  . Irregular bleeding 04/02/2010  . Auditory tube disorder 10/07/2009  . Avitaminosis D 01/30/2009  . Allergic rhinitis 01/11/2008  . Essential (primary) hypertension 01/11/2008   Myles Gip PT, DPT [phone]  01/22/2021, 5:33 PM  [facility] [address] [place], [place],  [address] Phone: [phone]   Fax:  [phone]  Name: Tina Henderson MRN: [id number] Date of Birth: Jun 28, 1971

## 2021-02-03 ENCOUNTER — Encounter: Payer: Self-pay | Admitting: Physical Therapy

## 2021-02-03 ENCOUNTER — Other Ambulatory Visit: Payer: Self-pay

## 2021-02-03 ENCOUNTER — Ambulatory Visit: Payer: BC Managed Care – PPO | Admitting: Physical Therapy

## 2021-02-03 DIAGNOSIS — M542 Cervicalgia: Secondary | ICD-10-CM | POA: Diagnosis not present

## 2021-02-03 DIAGNOSIS — M6281 Muscle weakness (generalized): Secondary | ICD-10-CM

## 2021-02-03 NOTE — Therapy (Signed)
[facility] [address]. [place], [place], [address] Phone: [phone]   Fax:  [phone]  Physical Therapy Treatment  Patient Details  Name: Tina Henderson MRN: [id number] Date of Birth: [birth year] Referring Provider (PT): Girtha Hake   Encounter Date: 02/03/2021   PT End of Session - 02/03/21 1631    Visit Number 3    Number of Visits 12    Date for PT Re-Evaluation 03/03/21    Authorization Type IE 01/20/2021    PT Start Time 1628    PT Stop Time 1708    PT Time Calculation (min) 40 min    Activity Tolerance Patient tolerated treatment well    Behavior During Therapy [facility] for tasks assessed/performed           Past Medical History:  Diagnosis Date  . Diabetes mellitus without complication ([place])   . Hyperlipidemia   . Hypertension     Past Surgical History:  Procedure Laterality Date  . ABDOMINAL HYSTERECTOMY  01/04/2011   Due to Endometriosis  . APPENDECTOMY  2002  . LAPAROSCOPIC OVARIAN      There were no vitals filed for this visit.   Subjective Assessment - 02/03/21 1630    Subjective Patient notes that she has had improved symptoms overall with less tingling, but today patient is having more LUE aches and feels like she slept on her neck wrong.    Currently in Pain? Yes    Pain Score 4     Pain Location Shoulder    Pain Orientation Left    Pain Descriptors / Indicators Aching           TREATMENT  Manual Therapy: STM and TPR performed to cervical paraspinal mm and L deltoid complex to allow for decreased tension and pain and improved posture and function Cervical Mobilizations with rotational movement for improved mobility, grade II/III Cervical extension CPAs, grade II/III for improved joint mobility Suboccipital release with manual distraction, multiple bouts, for decreased pain and spasm  Neuromuscular Re-education: Supine L UT stretch with manual overpressure for improved pain  modulation Supine L shoulder rhythmic stabilization, x30 sec Supine L shoulder, PNF D2 moving from passive to active with gentle manual resistance in midrange Supine L median nerve glides for improved neural mobility Seated L shoulder PNF D2 flexion, YTB, x5 reps for improved motor control   Patient educated throughout session on appropriate technique and form using multi-modal cueing, HEP, and activity modification. Patient articulated understanding and returned demonstration.  Patient Response to interventions: Notes some mildly increased numbness in L forefinger and thumb, but LUE/shoulder pain diminished  ASSESSMENT Patient presents to clinic with excellent motivation to participate in therapy. Patient demonstrates deficits in cervical ROM, neural mobility, posture, and LUE strength. Patient able to perform supine PNF D2 for L shoulder with good control and no symptoms in midrange during today's session and responded positively to manual interventions. Patient did have onset of distal paresthesia with end range D2 flexion, but patient noted numbness to be mild and not troubling. Patient will benefit from continued skilled therapeutic intervention to address remaining deficits in cervical ROM, neural mobility, posture, and LUE strength in order to increase function and improve overall QOL.     PT Long Term Goals - 01/20/21 1729      PT LONG TERM GOAL #1   Title Patient will be independent with HEP in order to decrease ankle pain and increase strength in order to improve pain-free function at  home and work.    Baseline IE: provided    Time 6    Period Weeks    Status New    Target Date 03/03/21      PT LONG TERM GOAL #2   Title Patient will decrease worst LUE tingling/numbness as reported on 0-10 scale (0 being no numbness and 10 being worst) by at least 2 points in order to demonstrate clinically significant reduction in neck pain.    Baseline IE: 7/10    Time 6    Period Weeks     Status New    Target Date 03/03/21      PT LONG TERM GOAL #3   Title Patient will be report "no difficulty" or "little difficulty" with placing a heavy object overhead and performing recreational activities including crocheting for improved participation at home and in the community.    Baseline IE: Quite a bit/moderate    Time 6    Period Weeks    Status New    Target Date 03/03/21      PT LONG TERM GOAL #4   Title Patient will demonstrate improved function as evidenced by a score of 67 on FOTO measure for full participation in activities at home and in the community.    Baseline IE: 61    Time 6    Period Weeks    Status New    Target Date 03/03/21      PT LONG TERM GOAL #5   Title Patient will demonstrate improved LUE shoulder flexion strength by at least 1/2 MMT grade for improved function at home and in the community.    Baseline IE: 3+/5    Time 6    Period Weeks    Status New    Target Date 03/03/21                 Plan - 02/03/21 1632    Clinical Impression Statement Patient presents to clinic with excellent motivation to participate in therapy. Patient demonstrates deficits in cervical ROM, neural mobility, posture, and LUE strength. Patient able to perform supine PNF D2 for L shoulder with good control and no symptoms in midrange during today's session and responded positively to manual interventions. Patient did have onset of distal paresthesia with end range D2 flexion, but patient noted numbness to be mild and not troubling. Patient will benefit from continued skilled therapeutic intervention to address remaining deficits in cervical ROM, neural mobility, posture, and LUE strength in order to increase function and improve overall QOL.    Personal Factors and Comorbidities Age;Comorbidity 3+;Past/Current Experience;Time since onset of injury/illness/exacerbation    Comorbidities clinical depression, HTN, anxiety, acid reflux, DM, hypercholesteremia,     Examination-Activity Limitations Lift;Reach Overhead    Examination-Participation Restrictions Occupation;Driving;Meal Prep;Shop;Laundry;Yard Work    [employer] officer Evolving/Moderate complexity    Rehab Potential Fair    PT Frequency 2x / week    PT Duration 6 weeks    PT Treatment/Interventions ADLs/Self Care Home Management;Aquatic Therapy;Cryotherapy;Electrical Stimulation;Moist Heat;Therapeutic exercise;Neuromuscular re-education;Patient/family education;Orthotic Fit/Training;Manual techniques;Passive range of motion;Dry needling;Taping;Spinal Manipulations;Joint Manipulations    PT Next Visit Plan manual as needed, gentle shoulder strengthening and postural re-ed    PT Home Exercise Plan L UT stretch and L median nerve glides.    Consulted and Agree with Plan of Care Patient           Patient will benefit from skilled therapeutic intervention in order to improve the following deficits and impairments:  Postural dysfunction,Pain,Improper body  mechanics,Decreased strength,Decreased activity tolerance,Impaired UE functional use,Decreased range of motion,Hypomobility  Visit Diagnosis: Cervicalgia  Muscle weakness (generalized)     Problem List Patient Active Problem List   Diagnosis Date Noted  . DKA (diabetic ketoacidosis) ([place]) 09/22/2016  . Hyponatremia 09/22/2016  . Acute renal insufficiency 09/22/2016  . Dehydration 09/22/2016  . Polycythemia 09/22/2016  . DKA (diabetic ketoacidoses) 09/22/2016  . Type 2 diabetes mellitus without complication, without long-term current use of insulin ([place]) 05/20/2016  . Adaptation reaction 12/14/2014  . Anxiety disorder 12/14/2014  . Airway hyperreactivity 12/14/2014  . Body mass index of 60 or higher 12/14/2014  . History of chicken pox 12/14/2014  . Clinical depression 12/14/2014  . 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 Acid reflux 12/14/2014  . Hypercholesteremia 12/14/2014  . Cannot sleep  12/14/2014  . Burning or prickling sensation 12/14/2014  . Bilateral polycystic ovarian syndrome 12/14/2014  . Restless leg 12/14/2014  . Irregular bleeding 04/02/2010  . Auditory tube disorder 10/07/2009  . Avitaminosis D 01/30/2009  . Allergic rhinitis 01/11/2008  . Essential (primary) hypertension 01/11/2008   Myles Gip PT, DPT [phone]  02/03/2021, 6:28 PM  [place] [facility] [address] [place], [place], [address] Phone: [phone]   Fax:  [phone]  Name: Tina Henderson MRN: [id number] Date of Birth: 25-Jan-1971

## 2021-02-05 ENCOUNTER — Ambulatory Visit: Payer: BC Managed Care – PPO | Admitting: Physical Therapy

## 2021-02-05 ENCOUNTER — Encounter: Payer: Self-pay | Admitting: Physical Therapy

## 2021-02-05 ENCOUNTER — Other Ambulatory Visit: Payer: Self-pay

## 2021-02-05 DIAGNOSIS — M542 Cervicalgia: Secondary | ICD-10-CM | POA: Diagnosis not present

## 2021-02-05 DIAGNOSIS — M6281 Muscle weakness (generalized): Secondary | ICD-10-CM

## 2021-02-05 NOTE — Therapy (Signed)
[facility] [address]. [place], [place], [address] Phone: [phone]   Fax:  [phone]  Physical Therapy Treatment  Patient Details  Name: Tina Henderson MRN: [id number] Date of Birth: [birth year] Referring Provider (PT): Girtha Hake   Encounter Date: 02/05/2021   PT End of Session - 02/05/21 1721    Visit Number 4    Number of Visits 12    Date for PT Re-Evaluation 03/03/21    Authorization Type IE 01/20/2021    PT Start Time 2458    PT Stop Time 1800    PT Time Calculation (min) 45 min    Activity Tolerance Patient tolerated treatment well    Behavior During Therapy [facility] for tasks assessed/performed           Past Medical History:  Diagnosis Date  . Diabetes mellitus without complication ([place])   . Hyperlipidemia   . Hypertension     Past Surgical History:  Procedure Laterality Date  . ABDOMINAL HYSTERECTOMY  01/04/2011   Due to Endometriosis  . APPENDECTOMY  2002  . LAPAROSCOPIC OVARIAN      There were no vitals filed for this visit.   Subjective Assessment - 02/05/21 1719    Subjective Patient reports she has been doing more filing today and neck is sore. Patient had a chance to do exercises and had no issues at home.    Currently in Pain? Yes    Pain Score 5     Pain Location Neck    Pain Orientation Left           TREATMENT  Manual Therapy: STM and TPR performed to L cervical and thoracic mm, L UT, and L deltoid complex to allow for decreased tension and pain and improved posture and function  Neuromuscular Re-education: UBE, reverse, x3 min for improved posterior chain activation and posture Scapular rows, RTB, x20 (in standing) for improved postural awareness/endurance Shoulder extension/chest expansion, RTB, x20 for improved postural awareness/endurance Airplane arms/ T (at 30 degrees abduction), RTB, x20 for improved postural awareness/endruance *aggravating to L shoulder  pain    Patient educated throughout session on appropriate technique and form using multi-modal cueing, HEP, and activity modification. Patient articulated understanding and returned demonstration.  Patient Response to interventions: 0/10 pain at end of session  ASSESSMENT Patient presents to clinic with excellent motivation to participate in therapy. Patient demonstrates deficits in cervical ROM, neural mobility, posture, and LUE strength. Patient tolerated all scapular motor control/postural endurance exercises well aside from abducted scapular retraction during today's session and responded positively to manual interventions. Patient will benefit from continued skilled therapeutic intervention to address remaining deficits in cervical ROM, neural mobility, posture, and LUE strength in order to increase function and improve overall QOL.     PT Long Term Goals - 01/20/21 1729      PT LONG TERM GOAL #1   Title Patient will be independent with HEP in order to decrease ankle pain and increase strength in order to improve pain-free function at home and work.    Baseline IE: provided    Time 6    Period Weeks    Status New    Target Date 03/03/21      PT LONG TERM GOAL #2   Title Patient will decrease worst LUE tingling/numbness as reported on 0-10 scale (0 being no numbness and 10 being worst) by at least 2 points in order to demonstrate clinically significant reduction in neck pain.  Baseline IE: 7/10    Time 6    Period Weeks    Status New    Target Date 03/03/21      PT LONG TERM GOAL #3   Title Patient will be report "no difficulty" or "little difficulty" with placing a heavy object overhead and performing recreational activities including crocheting for improved participation at home and in the community.    Baseline IE: Quite a bit/moderate    Time 6    Period Weeks    Status New    Target Date 03/03/21      PT LONG TERM GOAL #4   Title Patient will demonstrate improved  function as evidenced by a score of 67 on FOTO measure for full participation in activities at home and in the community.    Baseline IE: 61    Time 6    Period Weeks    Status New    Target Date 03/03/21      PT LONG TERM GOAL #5   Title Patient will demonstrate improved LUE shoulder flexion strength by at least 1/2 MMT grade for improved function at home and in the community.    Baseline IE: 3+/5    Time 6    Period Weeks    Status New    Target Date 03/03/21                 Plan - 02/05/21 1836    Clinical Impression Statement Patient presents to clinic with excellent motivation to participate in therapy. Patient demonstrates deficits in cervical ROM, neural mobility, posture, and LUE strength. Patient tolerated all scapular motor control/postural endurance exercises well aside from abducted scapular retraction during today's session and responded positively to manual interventions. Patient will benefit from continued skilled therapeutic intervention to address remaining deficits in cervical ROM, neural mobility, posture, and LUE strength in order to increase function and improve overall QOL.    Personal Factors and Comorbidities Age;Comorbidity 3+;Past/Current Experience;Time since onset of injury/illness/exacerbation    Comorbidities clinical depression, HTN, anxiety, acid reflux, DM, hypercholesteremia,    Examination-Activity Limitations Lift;Reach Overhead    Examination-Participation Restrictions Occupation;Driving;Meal Prep;Shop;Laundry;Yard Work    [employer] officer Evolving/Moderate complexity    Rehab Potential Fair    PT Frequency 2x / week    PT Duration 6 weeks    PT Treatment/Interventions ADLs/Self Care Home Management;Aquatic Therapy;Cryotherapy;Electrical Stimulation;Moist Heat;Therapeutic exercise;Neuromuscular re-education;Patient/family education;Orthotic Fit/Training;Manual techniques;Passive range of motion;Dry needling;Taping;Spinal  Manipulations;Joint Manipulations    PT Next Visit Plan manual as needed, gentle shoulder strengthening and postural re-ed    PT Home Exercise Plan L UT stretch and L median nerve glides.    Consulted and Agree with Plan of Care Patient           Patient will benefit from skilled therapeutic intervention in order to improve the following deficits and impairments:  Postural dysfunction,Pain,Improper body mechanics,Decreased strength,Decreased activity tolerance,Impaired UE functional use,Decreased range of motion,Hypomobility  Visit Diagnosis: Cervicalgia  Muscle weakness (generalized)     Problem List Patient Active Problem List   Diagnosis Date Noted  . DKA (diabetic ketoacidosis) ([place]) 09/22/2016  . Hyponatremia 09/22/2016  . Acute renal insufficiency 09/22/2016  . Dehydration 09/22/2016  . Polycythemia 09/22/2016  . DKA (diabetic ketoacidoses) 09/22/2016  . Type 2 diabetes mellitus without complication, without long-term current use of insulin ([place]) 05/20/2016  . Adaptation reaction 12/14/2014  . Anxiety disorder 12/14/2014  . Airway hyperreactivity 12/14/2014  . Body mass index of 60 or higher 12/14/2014  .  History of chicken pox 12/14/2014  . Clinical depression 12/14/2014  . 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 Acid reflux 12/14/2014  . Hypercholesteremia 12/14/2014  . Cannot sleep 12/14/2014  . Burning or prickling sensation 12/14/2014  . Bilateral polycystic ovarian syndrome 12/14/2014  . Restless leg 12/14/2014  . Irregular bleeding 04/02/2010  . Auditory tube disorder 10/07/2009  . Avitaminosis D 01/30/2009  . Allergic rhinitis 01/11/2008  . Essential (primary) hypertension 01/11/2008   Myles Gip PT, DPT [phone]  02/05/2021, 6:36 PM  [place] [facility] [address] [place], [place], [address] Phone: [phone]   Fax:  [phone]  Name: EPSIE WALTHALL MRN: [id number] Date of Birth:  May 22, 1971

## 2021-02-10 ENCOUNTER — Encounter: Payer: BC Managed Care – PPO | Admitting: Physical Therapy

## 2021-02-12 ENCOUNTER — Encounter: Payer: Self-pay | Admitting: Physical Therapy

## 2021-02-12 ENCOUNTER — Ambulatory Visit: Payer: BC Managed Care – PPO | Attending: Physical Medicine & Rehabilitation | Admitting: Physical Therapy

## 2021-02-12 ENCOUNTER — Other Ambulatory Visit: Payer: Self-pay

## 2021-02-12 DIAGNOSIS — M542 Cervicalgia: Secondary | ICD-10-CM | POA: Insufficient documentation

## 2021-02-12 DIAGNOSIS — M6281 Muscle weakness (generalized): Secondary | ICD-10-CM | POA: Diagnosis present

## 2021-02-12 NOTE — Therapy (Signed)
[facility] [address] [place], [place], [address] Phone: [phone]   Fax:  [phone]  Physical Therapy Treatment  Patient Details  Name: Tina Henderson MRN: [id number] Date of Birth: [birth year] Referring Provider (PT): Girtha Hake   Encounter Date: 02/12/2021   PT End of Session - 02/12/21 1705    Visit Number 5    Number of Visits 12    Date for PT Re-Evaluation 03/03/21    Authorization Type IE 01/20/2021    PT Start Time 1700    PT Stop Time 1740    PT Time Calculation (min) 40 min    Activity Tolerance Patient tolerated treatment well    Behavior During Therapy [facility] for tasks assessed/performed           Past Medical History:  Diagnosis Date  . Diabetes mellitus without complication ([place])   . Hyperlipidemia   . Hypertension     Past Surgical History:  Procedure Laterality Date  . ABDOMINAL HYSTERECTOMY  01/04/2011   Due to Endometriosis  . APPENDECTOMY  2002  . LAPAROSCOPIC OVARIAN      There were no vitals filed for this visit.   Subjective Assessment - 02/12/21 1702    Subjective Patient states that she has been stressed with work demands. Patient has not had chance to do exercises because of work demands. Patient notes her pain is more at the center of her cervicothoracic junction.    Currently in Pain? Yes    Pain Score 5     Pain Location Neck    Pain Orientation Lower           TREATMENT  Manual Therapy: STM and TPR performed to L cervical and thoracic mm, and L UT to allow for decreased tension and pain and improved posture and function Cervical extension CPAs, grade II/III for improved joint mobility Suboccipital release with manual distraction, multiple bouts, for decreased pain and spasm  Neuromuscular Re-education: UBE, reverse, x4 min for improved posterior chain activation and posture Scapular rows, RTB, 2x15 (in standing) for improved postural awareness/endurance Shoulder  extension/chest expansion, RTB, 2x15 for improved postural awareness/endurance Cervical retraction with AROM rotation, extension, flexion, 2x5 Reverse shoulder rolls with cervical retraction, 2x5  Patient educated throughout session on appropriate technique and form using multi-modal cueing, HEP, and activity modification. Patient articulated understanding and returned demonstration.  Patient Response to interventions: Does not report increased pain  ASSESSMENT Patient presents to clinic with excellent motivation to participate in therapy. Patient demonstrates deficits in cervical ROM, neural mobility, posture, and LUE strength. Patient able to perform increased repetitions of postural endurance exercises during today's session and responded positively to manual interventions. Patient will benefit from continued skilled therapeutic intervention to address remaining deficits in cervical ROM, neural mobility, posture, and LUE strength in order to increase function and improve overall QOL.     PT Long Term Goals - 01/20/21 1729      PT LONG TERM GOAL #1   Title Patient will be independent with HEP in order to decrease ankle pain and increase strength in order to improve pain-free function at home and work.    Baseline IE: provided    Time 6    Period Weeks    Status New    Target Date 03/03/21      PT LONG TERM GOAL #2   Title Patient will decrease worst LUE tingling/numbness as reported on 0-10 scale (0 being no numbness and 10 being worst)  by at least 2 points in order to demonstrate clinically significant reduction in neck pain.    Baseline IE: 7/10    Time 6    Period Weeks    Status New    Target Date 03/03/21      PT LONG TERM GOAL #3   Title Patient will be report "no difficulty" or "little difficulty" with placing a heavy object overhead and performing recreational activities including crocheting for improved participation at home and in the community.    Baseline IE: Quite a  bit/moderate    Time 6    Period Weeks    Status New    Target Date 03/03/21      PT LONG TERM GOAL #4   Title Patient will demonstrate improved function as evidenced by a score of 67 on FOTO measure for full participation in activities at home and in the community.    Baseline IE: 61    Time 6    Period Weeks    Status New    Target Date 03/03/21      PT LONG TERM GOAL #5   Title Patient will demonstrate improved LUE shoulder flexion strength by at least 1/2 MMT grade for improved function at home and in the community.    Baseline IE: 3+/5    Time 6    Period Weeks    Status New    Target Date 03/03/21                 Plan - 02/12/21 1705    Clinical Impression Statement Patient presents to clinic with excellent motivation to participate in therapy. Patient demonstrates deficits in cervical ROM, neural mobility, posture, and LUE strength. Patient able to perform increased repetitions of postural endurance exercises during today's session and responded positively to manual interventions. Patient will benefit from continued skilled therapeutic intervention to address remaining deficits in cervical ROM, neural mobility, posture, and LUE strength in order to increase function and improve overall QOL.    Personal Factors and Comorbidities Age;Comorbidity 3+;Past/Current Experience;Time since onset of injury/illness/exacerbation    Comorbidities clinical depression, HTN, anxiety, acid reflux, DM, hypercholesteremia,    Examination-Activity Limitations Lift;Reach Overhead    Examination-Participation Restrictions Occupation;Driving;Meal Prep;Shop;Laundry;Yard Work    [employer]chant [employer] officer Evolving/Moderate complexity    Rehab Potential Fair    PT Frequency 2x / week    PT Duration 6 weeks    PT Treatment/Interventions ADLs/Self Care Home Management;Aquatic Therapy;Cryotherapy;Electrical Stimulation;Moist Heat;Therapeutic exercise;Neuromuscular  re-education;Patient/family education;Orthotic Fit/Training;Manual techniques;Passive range of motion;Dry needling;Taping;Spinal Manipulations;Joint Manipulations    PT Next Visit Plan manual as needed, gentle shoulder strengthening and postural re-ed    PT Home Exercise Plan L UT stretch and L median nerve glides.    Consulted and Agree with Plan of Care Patient           Patient will benefit from skilled therapeutic intervention in order to improve the following deficits and impairments:  Postural dysfunction,Pain,Improper body mechanics,Decreased strength,Decreased activity tolerance,Impaired UE functional use,Decreased range of motion,Hypomobility  Visit Diagnosis: Cervicalgia  Muscle weakness (generalized)     Problem List Patient Active Problem List   Diagnosis Date Noted  . DKA (diabetic ketoacidosis) ([place]) 09/22/2016  . Hyponatremia 09/22/2016  . Acute renal insufficiency 09/22/2016  . Dehydration 09/22/2016  . Polycythemia 09/22/2016  . DKA (diabetic ketoacidoses) 09/22/2016  . Type 2 diabetes mellitus without complication, without long-term current use of insulin ([place]) 05/20/2016  . Adaptation reaction 12/14/2014  . Anxiety disorder 12/14/2014  .  Airway hyperreactivity 12/14/2014  . Body mass index of 60 or higher 12/14/2014  . History of chicken pox 12/14/2014  . Clinical depression 12/14/2014  . 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 Acid reflux 12/14/2014  . Hypercholesteremia 12/14/2014  . Cannot sleep 12/14/2014  . Burning or prickling sensation 12/14/2014  . Bilateral polycystic ovarian syndrome 12/14/2014  . Restless leg 12/14/2014  . Irregular bleeding 04/02/2010  . Auditory tube disorder 10/07/2009  . Avitaminosis D 01/30/2009  . Allergic rhinitis 01/11/2008  . Essential (primary) hypertension 01/11/2008   Myles Gip PT, DPT [phone]  02/12/2021, 6:06 PM  [place] [facility] [address] [place], [place], [address] Phone: [phone]   Fax:  [phone]  Name: Tina Henderson MRN: [id number] Date of Birth: Jun 20, 1971

## 2021-02-17 ENCOUNTER — Other Ambulatory Visit: Payer: Self-pay

## 2021-02-17 ENCOUNTER — Encounter: Payer: Self-pay | Admitting: Physical Therapy

## 2021-02-17 ENCOUNTER — Ambulatory Visit: Payer: BC Managed Care – PPO | Admitting: Physical Therapy

## 2021-02-17 DIAGNOSIS — M6281 Muscle weakness (generalized): Secondary | ICD-10-CM

## 2021-02-17 DIAGNOSIS — M542 Cervicalgia: Secondary | ICD-10-CM | POA: Diagnosis not present

## 2021-02-17 NOTE — Therapy (Signed)
[facility] [address]. [place], [place], [address] Phone: [phone]   Fax:  [phone]  Physical Therapy Treatment  Patient Details  Name: Tina Henderson MRN: [id number] Date of Birth: 12/19/1970 Referring Provider (PT): Girtha Hake   Encounter Date: 02/17/2021   PT End of Session - 02/17/21 1801    Visit Number 6    Number of Visits 12    Date for PT Re-Evaluation 03/03/21    Authorization Type IE 01/20/2021    Authorization - Visit Number 6    Authorization - Number of Visits 10    Progress Note Due on Visit 10    PT Start Time 1645    PT Stop Time 1735    PT Time Calculation (min) 50 min    Activity Tolerance Patient tolerated treatment well    Behavior During Therapy [facility] for tasks assessed/performed           Past Medical History:  Diagnosis Date  . Diabetes mellitus without complication ([place])   . Hyperlipidemia   . Hypertension     Past Surgical History:  Procedure Laterality Date  . ABDOMINAL HYSTERECTOMY  01/04/2011   Due to Endometriosis  . APPENDECTOMY  2002  . LAPAROSCOPIC OVARIAN      There were no vitals filed for this visit.   Subjective Assessment - 02/17/21 1651    Subjective Paitent reports not having as much radiating pain down LUE. Patient reports she does have remaining numbness affecting her 1st-2nd digits. Patient reports with computer work she does have minor discomfort affecting L paracervical region. Patient reports some moderate referral to UT region.    Currently in Pain? No/denies           Objective Findings:  Cervical AROM screen Flexion 75% EXT 100% SB R/L 100% Rotate R/L 100%  ULTT: Median nerve: R Negative, L Positive  TREATMENT   Manual Therapy: STM and TPR performed to L cervical and periscapular mm, and L UT (in supine) to allow for decreased tension and pain and improved posture and function Cervical extension CPAs (in prone), grade III C3-7 for improved  joint mobility CPAs (in prone) along T3-T10 grade III for improved mobility   [*not today*] Suboccipital release with manual distraction, multiple bouts, for decreased pain and spasm   Neuromuscular Re-education: UBE, reverse, x4 min for improved posterior chain activation and posture Bilateral shoulder ER with scapular retraction; at wall with upright posture; 2x10 Reverse shoulder rolls with cervical retraction, 2x5 Median nerve gliders; 2x10, LUE  [not today]  Cervical retraction with AROM rotation, extension, flexion, 2x5    Patient educated throughout session on appropriate technique and form using multi-modal cueing, HEP, and activity modification. Patient articulated understanding and returned demonstration.   Patient Response to interventions: No notable pain to report at conclusion of session, no reproduction of paresthesias     PT Long Term Goals - 02/17/21 1806      PT LONG TERM GOAL #1   Title Patient will be independent with HEP in order to decrease ankle pain and increase strength in order to improve pain-free function at home and work.    Baseline IE: provided    Time 6    Period Weeks    Status Partially Met    Target Date 03/03/21      PT LONG TERM GOAL #2   Title Patient will decrease worst LUE tingling/numbness as reported on 0-10 scale (0 being no numbness and 10 being  worst) by at least 2 points in order to demonstrate clinically significant reduction in neck pain.    Baseline IE: 7/10    Time 6    Period Weeks    Status Partially Met    Target Date 03/03/21      PT LONG TERM GOAL #3   Title Patient will be report "no difficulty" or "little difficulty" with placing a heavy object overhead and performing recreational activities including crocheting for improved participation at home and in the community.    Baseline 02/17/2021 "Moderate difficulty" with placing an object overhead, "no difficulty" with performing recreational activities    Time 6     Period Weeks    Status Partially Met    Target Date 03/03/21      PT LONG TERM GOAL #4   Title Patient will demonstrate improved function as evidenced by a score of 67 on FOTO measure for full participation in activities at home and in the community.    Baseline 02/17/2021 FOTO 67    Time 6    Period Weeks    Status Achieved    Target Date 03/03/21      PT LONG TERM GOAL #5   Title Patient will demonstrate improved LUE shoulder flexion strength by at least 1/2 MMT grade for improved function at home and in the community.    Baseline IE: 3+/5    Time 6    Period Weeks    Status On-going    Target Date 03/03/21                 Plan - 02/18/21 2035    Clinical Impression Statement Patient presents to clinic with excellent motivation to participate in therapy. Patient has significantly reduced paresthesias/UE referred symptoms. She present with normal ROM in all planes without significant reproduction of pain with only mild cervical spine flexion ROM deficit. Pt has no notable sensitivity affecting paraspinal mm or periscapular mm this evening. Pt does have ongoing neural tension and will benefit from further work on neurodynamics. Patient has been working on gliders since outset of PT; will progress neurodynamics as tolerated.    Personal Factors and Comorbidities Age;Comorbidity 3+;Past/Current Experience;Time since onset of injury/illness/exacerbation    Comorbidities clinical depression, HTN, anxiety, acid reflux, DM, hypercholesteremia,    Examination-Activity Limitations Lift;Reach Overhead    Examination-Participation Restrictions Occupation;Driving;Meal Prep;Shop;Laundry;Yard Work    [employer] Evolving/Moderate complexity    Rehab Potential Fair    PT Frequency 2x / week    PT Duration 6 weeks    PT Treatment/Interventions ADLs/Self Care Home Management;Aquatic Therapy;Cryotherapy;Electrical Stimulation;Moist Heat;Therapeutic exercise;Neuromuscular  re-education;Patient/family education;Orthotic Fit/Training;Manual techniques;Passive range of motion;Dry needling;Taping;Spinal Manipulations;Joint Manipulations    PT Next Visit Plan manual as needed, gentle shoulder strengthening and postural re-ed    PT Home Exercise Plan L UT stretch and L median nerve glides.    Consulted and Agree with Plan of Care Patient           Patient will benefit from skilled therapeutic intervention in order to improve the following deficits and impairments:  Postural dysfunction,Pain,Improper body mechanics,Decreased strength,Decreased activity tolerance,Impaired UE functional use,Decreased range of motion,Hypomobility  Visit Diagnosis: Cervicalgia  Muscle weakness (generalized)     Problem List Patient Active Problem List   Diagnosis Date Noted  . DKA (diabetic ketoacidosis) ([place]) 09/22/2016  . Hyponatremia 09/22/2016  . Acute renal insufficiency 09/22/2016  . Dehydration 09/22/2016  . Polycythemia 09/22/2016  . DKA (diabetic ketoacidoses) 09/22/2016  . Type 2 diabetes  mellitus without complication, without long-term current use of insulin ([place]) 05/20/2016  . Adaptation reaction 12/14/2014  . Anxiety disorder 12/14/2014  . Airway hyperreactivity 12/14/2014  . Body mass index of 60 or higher 12/14/2014  . History of chicken pox 12/14/2014  . Clinical depression 12/14/2014  . 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 Acid reflux 12/14/2014  . Hypercholesteremia 12/14/2014  . Cannot sleep 12/14/2014  . Burning or prickling sensation 12/14/2014  . Bilateral polycystic ovarian syndrome 12/14/2014  . Restless leg 12/14/2014  . Irregular bleeding 04/02/2010  . Auditory tube disorder 10/07/2009  . Avitaminosis D 01/30/2009  . Allergic rhinitis 01/11/2008  . Essential (primary) hypertension 01/11/2008   Valentina Gu, PT, DPT [phone] Eilleen Kempf 02/18/2021, 8:38 PM  [facility] [address] [place], [place], [address] Phone: [phone]   Fax:  [phone]  Name: Tina Henderson MRN: [id number] Date of Birth: [birth year]

## 2021-02-19 ENCOUNTER — Ambulatory Visit: Payer: BC Managed Care – PPO | Admitting: Physical Therapy

## 2021-02-24 ENCOUNTER — Encounter: Payer: BC Managed Care – PPO | Admitting: Physical Therapy

## 2021-02-26 ENCOUNTER — Encounter: Payer: BC Managed Care – PPO | Admitting: Physical Therapy

## 2021-03-03 ENCOUNTER — Encounter: Payer: BC Managed Care – PPO | Admitting: Physical Therapy

## 2021-03-05 ENCOUNTER — Encounter: Payer: BC Managed Care – PPO | Admitting: Physical Therapy

## 2021-03-10 ENCOUNTER — Encounter: Payer: BC Managed Care – PPO | Admitting: Physical Therapy

## 2021-03-12 ENCOUNTER — Encounter: Payer: BC Managed Care – PPO | Admitting: Physical Therapy

## 2021-05-29 ENCOUNTER — Other Ambulatory Visit: Payer: Self-pay | Admitting: Physical Medicine & Rehabilitation

## 2021-05-29 DIAGNOSIS — M542 Cervicalgia: Secondary | ICD-10-CM

## 2021-06-04 ENCOUNTER — Other Ambulatory Visit: Payer: Self-pay

## 2021-06-04 ENCOUNTER — Ambulatory Visit
Admission: RE | Admit: 2021-06-04 | Discharge: 2021-06-04 | Disposition: A | Discharge status: Home / Self Care | Payer: BC Managed Care – PPO | Source: Ambulatory Visit | Attending: Physical Medicine & Rehabilitation | Admitting: Physical Medicine & Rehabilitation

## 2021-06-04 DIAGNOSIS — M542 Cervicalgia: Secondary | ICD-10-CM | POA: Insufficient documentation

## 2021-06-15 ENCOUNTER — Other Ambulatory Visit: Payer: Self-pay | Admitting: Neurosurgery

## 2021-06-18 ENCOUNTER — Other Ambulatory Visit: Payer: Self-pay | Admitting: Physician Assistant

## 2021-06-18 DIAGNOSIS — Z1231 Encounter for screening mammogram for malignant neoplasm of breast: Secondary | ICD-10-CM

## 2021-07-02 ENCOUNTER — Other Ambulatory Visit: Payer: Self-pay

## 2021-07-02 ENCOUNTER — Encounter
Admission: RE | Admit: 2021-07-02 | Discharge: 2021-07-02 | Disposition: A | Discharge status: Home / Self Care | Payer: BC Managed Care – PPO | Source: Ambulatory Visit | Attending: Neurosurgery | Admitting: Neurosurgery

## 2021-07-02 DIAGNOSIS — E119 Type 2 diabetes mellitus without complications: Secondary | ICD-10-CM | POA: Diagnosis not present

## 2021-07-02 DIAGNOSIS — I10 Essential (primary) hypertension: Secondary | ICD-10-CM | POA: Insufficient documentation

## 2021-07-02 DIAGNOSIS — Z01818 Encounter for other preprocedural examination: Secondary | ICD-10-CM | POA: Insufficient documentation

## 2021-07-02 HISTORY — DX: Unspecified asthma, uncomplicated: J45.909

## 2021-07-02 HISTORY — DX: Anxiety disorder, unspecified: F41.9

## 2021-07-02 HISTORY — DX: Gastro-esophageal reflux disease without esophagitis: K21.9

## 2021-07-02 LAB — BASIC METABOLIC PANEL
Anion gap: 10 (ref 5–15)
BUN: 15 mg/dL (ref 6–20)
CO2: 27 mmol/L (ref 22–32)
Calcium: 9.2 mg/dL (ref 8.9–10.3)
Chloride: 102 mmol/L (ref 98–111)
Creatinine, Ser: 0.49 mg/dL (ref 0.44–1.00)
GFR, Estimated: >60 mL/min (ref >60)
Glucose, Bld: 83 mg/dL (ref 70–99)
Potassium: 3.3 mmol/L — ABNORMAL LOW (ref 3.5–5.1)
Sodium: 139 mmol/L (ref 135–145)

## 2021-07-02 LAB — TYPE AND SCREEN
ABO/RH(D): O NEG
Antibody Screen: NEGATIVE
DAT, IgG: NEGATIVE
Weak D: POSITIVE

## 2021-07-02 LAB — CBC
HCT: 39.9 % (ref 36.0–46.0)
Hemoglobin: 13.2 g/dL (ref 12.0–15.0)
MCH: 28.3 pg (ref 26.0–34.0)
MCHC: 33.1 g/dL (ref 30.0–36.0)
MCV: 85.4 fL (ref 80.0–100.0)
Platelets: 289 10*3/uL (ref 150–400)
RBC: 4.67 MIL/uL (ref 3.87–5.11)
RDW: 13.2 % (ref 11.5–15.5)
WBC: 9.8 10*3/uL (ref 4.0–10.5)
nRBC: 0 % (ref 0.0–0.2)

## 2021-07-02 LAB — APTT: aPTT: 29 seconds (ref 24–36)

## 2021-07-02 LAB — PROTIME-INR
INR: 0.9 (ref 0.8–1.2)
Prothrombin Time: 12.6 seconds (ref 11.4–15.2)

## 2021-07-02 LAB — URINALYSIS, ROUTINE W REFLEX MICROSCOPIC
Bilirubin Urine: NEGATIVE
Glucose, UA: NEGATIVE mg/dL
Hgb urine dipstick: NEGATIVE
Ketones, ur: NEGATIVE mg/dL
Leukocytes,Ua: NEGATIVE
Nitrite: NEGATIVE
Protein, ur: NEGATIVE mg/dL
Specific Gravity, Urine: 1.019 (ref 1.005–1.030)
pH: 5 (ref 5.0–8.0)

## 2021-07-02 LAB — SURGICAL PCR SCREEN
MRSA, PCR: NEGATIVE
Staphylococcus aureus: POSITIVE — AB

## 2021-07-02 NOTE — Patient Instructions (Signed)
Your procedure is scheduled on: 07/13/21 Report to [facility]. To find out your arrival time please call [phone] between 1PM - 3PM on 07/10/21.  Remember: Instructions that are not followed completely may result in serious medical risk, up to and including death, or upon the discretion of your surgeon and anesthesiologist your surgery may need to be rescheduled.     _X__ 1. Do not eat food after midnight the night before your procedure.                 No gum chewing or hard candies. You may drink clear liquids up to 2 hours                 before you are scheduled to arrive for your surgery-                   Diabetics water only  __X__2.  On the morning of surgery brush your teeth with toothpaste and water, you                 may rinse your mouth with mouthwash if you wish.  Do not swallow any              toothpaste of mouthwash.     _X__ 3.  No Alcohol for 24 hours before or after surgery.   _X__ 4.  Do Not Smoke or use e-cigarettes For 24 Hours Prior to Your Surgery.                 Do not use any chewable tobacco products for at least 6 hours prior to                 surgery.  ____  5.  Bring all medications with you on the day of surgery if instructed.   __X__  6.  Notify your doctor if there is any change in your medical condition      (cold, fever, infections).     Do not wear jewelry, make-up, hairpins, clips or nail polish. Do not wear lotions, powders, or perfumes.  Do not shave body hair 48 hours prior to surgery. Men may shave face and neck. Do not bring valuables to the hospital.    [facility] is not responsible for any belongings or valuables.  Contacts, dentures/partials or body piercings may not be worn into surgery. Bring a case for your contacts, glasses or hearing aids, a denture cup will be supplied. Leave your suitcase in the car. After surgery it may be brought to your room. For patients admitted  to the hospital, discharge time is determined by your treatment team.   Patients discharged the day of surgery will not be allowed to drive home.   Please read over the following fact sheets that you were given:   MRSA Information  __X__ Take these medicines the morning of surgery with A SIP OF WATER:    1. loratadine (CLARITIN) 10 MG tablet  2. gabapentin (NEURONTIN) 900 MG capsule  3. pantoprazole (PROTONIX) 40 MG tablet  4. ALPRAZolam (XANAX) 0.5 MG tablet  5.  6.  ____ Fleet Enema (as directed)   __X__ Use CHG Soap/SAGE wipes as directed  ____ Use inhalers on the day of surgery  __X__ Stop metformin/Janumet/Farxiga 2 days prior to surgery  LAST DOSE Saturday MORNING  ____ Take 1/2 of usual insulin dose the night before surgery. No insulin the morning  of surgery.   ____ Stop Blood Thinners Coumadin/Plavix/Xarelto/Pleta/Pradaxa/Eliquis/Effient/Aspirin  on   Or contact your Surgeon, Cardiologist or Medical Doctor regarding  ability to stop your blood thinners  __X__ Stop Anti-inflammatories 7 days before surgery such as Advil, Ibuprofen, Motrin,  BC or Goodies Powder, Naprosyn, Naproxen, Aleve, Aspirin   STOP MELOXICAM AS INSTRUCTED (1 WEEK PRIOR AND 3 MONTHS AFTER SURGERY)  __X__ Stop all herbal supplements, fish oil or vitamin E until after surgery.    ____ Bring C-Pap to the hospital.

## 2021-07-07 ENCOUNTER — Other Ambulatory Visit: Payer: Self-pay

## 2021-07-07 ENCOUNTER — Ambulatory Visit
Admission: RE | Admit: 2021-07-07 | Discharge: 2021-07-07 | Disposition: A | Discharge status: Home / Self Care | Payer: BC Managed Care – PPO | Source: Ambulatory Visit | Attending: Physician Assistant | Admitting: Physician Assistant

## 2021-07-07 DIAGNOSIS — Z1231 Encounter for screening mammogram for malignant neoplasm of breast: Secondary | ICD-10-CM | POA: Insufficient documentation

## 2021-07-09 ENCOUNTER — Other Ambulatory Visit: Payer: Self-pay

## 2021-07-09 ENCOUNTER — Other Ambulatory Visit
Admission: RE | Admit: 2021-07-09 | Discharge: 2021-07-09 | Disposition: A | Discharge status: Home / Self Care | Payer: BC Managed Care – PPO | Source: Ambulatory Visit | Attending: Neurosurgery | Admitting: Neurosurgery

## 2021-07-09 DIAGNOSIS — Z20822 Contact with and (suspected) exposure to covid-19: Secondary | ICD-10-CM | POA: Insufficient documentation

## 2021-07-09 DIAGNOSIS — Z01812 Encounter for preprocedural laboratory examination: Secondary | ICD-10-CM | POA: Insufficient documentation

## 2021-07-10 LAB — SARS CORONAVIRUS 2 (TAT 6-24 HRS): SARS Coronavirus 2: NEGATIVE

## 2021-07-13 ENCOUNTER — Ambulatory Visit: Payer: BC Managed Care – PPO

## 2021-07-13 ENCOUNTER — Other Ambulatory Visit: Payer: Self-pay

## 2021-07-13 ENCOUNTER — Encounter: Payer: Self-pay | Admitting: Neurosurgery

## 2021-07-13 ENCOUNTER — Observation Stay
Admission: RE | Admit: 2021-07-13 | Discharge: 2021-07-14 | Disposition: A | Discharge status: Home / Self Care | Payer: BC Managed Care – PPO | Attending: Neurosurgery | Admitting: Neurosurgery

## 2021-07-13 ENCOUNTER — Ambulatory Visit: Payer: BC Managed Care – PPO | Admitting: Certified Registered"

## 2021-07-13 ENCOUNTER — Encounter
Admission: RE | Disposition: A | Discharge status: Home / Self Care | Payer: Self-pay | Source: Home / Self Care | Attending: Neurosurgery

## 2021-07-13 ENCOUNTER — Ambulatory Visit: Payer: BC Managed Care – PPO | Admitting: Urgent Care

## 2021-07-13 DIAGNOSIS — Z7984 Long term (current) use of oral hypoglycemic drugs: Secondary | ICD-10-CM | POA: Diagnosis not present

## 2021-07-13 DIAGNOSIS — M5412 Radiculopathy, cervical region: Secondary | ICD-10-CM | POA: Diagnosis present

## 2021-07-13 DIAGNOSIS — M4802 Spinal stenosis, cervical region: Principal | ICD-10-CM | POA: Insufficient documentation

## 2021-07-13 DIAGNOSIS — I10 Essential (primary) hypertension: Secondary | ICD-10-CM | POA: Insufficient documentation

## 2021-07-13 DIAGNOSIS — E119 Type 2 diabetes mellitus without complications: Secondary | ICD-10-CM | POA: Insufficient documentation

## 2021-07-13 DIAGNOSIS — M4322 Fusion of spine, cervical region: Secondary | ICD-10-CM

## 2021-07-13 DIAGNOSIS — J45909 Unspecified asthma, uncomplicated: Secondary | ICD-10-CM | POA: Diagnosis not present

## 2021-07-13 DIAGNOSIS — Z419 Encounter for procedure for purposes other than remedying health state, unspecified: Secondary | ICD-10-CM

## 2021-07-13 HISTORY — PX: ANTERIOR CERVICAL DECOMP/DISCECTOMY FUSION: SHX1161

## 2021-07-13 LAB — GLUCOSE, CAPILLARY
Glucose-Capillary: 121 mg/dL — ABNORMAL HIGH (ref 70–99)
Glucose-Capillary: 150 mg/dL — ABNORMAL HIGH (ref 70–99)
Glucose-Capillary: 201 mg/dL — ABNORMAL HIGH (ref 70–99)
Glucose-Capillary: 193 mg/dL — ABNORMAL HIGH (ref 70–99)

## 2021-07-13 SURGERY — ANTERIOR CERVICAL DECOMPRESSION/DISCECTOMY FUSION 2 LEVELS
Anesthesia: General

## 2021-07-13 MED ORDER — FENTANYL CITRATE (PF) 100 MCG/2ML IJ SOLN
25.0000 ug | INTRAMUSCULAR | Status: AC | PRN
  Administered 2021-07-13 (×4): 25 ug via INTRAVENOUS

## 2021-07-13 MED ORDER — ALPRAZOLAM 0.5 MG PO TABS: 0.5000 mg | ORAL_TABLET | Freq: Three times a day (TID) | ORAL | Status: DC | PRN

## 2021-07-13 MED ORDER — ACETAMINOPHEN 325 MG PO TABS: 650.0000 mg | ORAL_TABLET | ORAL | Status: DC | PRN

## 2021-07-13 MED ORDER — CEFAZOLIN SODIUM-DEXTROSE 2-4 GM/100ML-% IV SOLN
INTRAVENOUS | Status: AC
  Filled 2021-07-13: qty 100

## 2021-07-13 MED ORDER — FLUTICASONE PROPIONATE 50 MCG/ACT NA SUSP
2.0000 | Freq: Every day | NASAL | Status: DC
  Filled 2021-07-13: qty 16

## 2021-07-13 MED ORDER — HEMOSTATIC AGENTS (NO CHARGE) OPTIME
TOPICAL | Status: DC | PRN
  Administered 2021-07-13: 1 via TOPICAL

## 2021-07-13 MED ORDER — CEFAZOLIN SODIUM-DEXTROSE 2-4 GM/100ML-% IV SOLN
2.0000 g | Freq: Three times a day (TID) | INTRAVENOUS | Status: AC
  Administered 2021-07-13 – 2021-07-14 (×2): 2 g via INTRAVENOUS

## 2021-07-13 MED ORDER — PANTOPRAZOLE SODIUM 40 MG PO TBEC
40.0000 mg | DELAYED_RELEASE_TABLET | Freq: Every day | ORAL | Status: DC
  Administered 2021-07-14: 40 mg via ORAL
  Filled 2021-07-13: qty 1

## 2021-07-13 MED ORDER — HYDROMORPHONE HCL 1 MG/ML IJ SOLN
INTRAMUSCULAR | Status: AC
  Filled 2021-07-13: qty 1

## 2021-07-13 MED ORDER — OXYCODONE HCL 5 MG PO TABS
ORAL_TABLET | ORAL | Status: AC
  Administered 2021-07-13: 5 mg via ORAL
  Filled 2021-07-13: qty 1

## 2021-07-13 MED ORDER — SODIUM CHLORIDE 0.9 % IV SOLN: 250.0000 mL | INTRAVENOUS | Status: DC

## 2021-07-13 MED ORDER — FENTANYL CITRATE (PF) 100 MCG/2ML IJ SOLN
INTRAMUSCULAR | Status: AC
  Filled 2021-07-13: qty 2

## 2021-07-13 MED ORDER — LOSARTAN POTASSIUM 50 MG PO TABS
50.0000 mg | ORAL_TABLET | Freq: Every day | ORAL | Status: DC
  Administered 2021-07-14: 50 mg via ORAL
  Filled 2021-07-13: qty 1

## 2021-07-13 MED ORDER — VASOPRESSIN 20 UNIT/ML IV SOLN
INTRAVENOUS | Status: DC | PRN
  Administered 2021-07-13 (×10): 2 [IU] via INTRAVENOUS

## 2021-07-13 MED ORDER — ACETAMINOPHEN 10 MG/ML IV SOLN: 1000.0000 mg | Freq: Once | INTRAVENOUS | Status: DC | PRN

## 2021-07-13 MED ORDER — REMIFENTANIL HCL 1 MG IV SOLR
INTRAVENOUS | Status: DC | PRN
  Administered 2021-07-13: .1 ug/kg/min via INTRAVENOUS

## 2021-07-13 MED ORDER — LOSARTAN POTASSIUM-HCTZ 50-12.5 MG PO TABS: 1.0000 | ORAL_TABLET | Freq: Every day | ORAL | Status: DC

## 2021-07-13 MED ORDER — EPHEDRINE 5 MG/ML INJ
INTRAVENOUS | Status: AC
  Filled 2021-07-13: qty 5

## 2021-07-13 MED ORDER — SODIUM CHLORIDE 0.9% FLUSH: 3.0000 mL | Freq: Two times a day (BID) | INTRAVENOUS | Status: DC

## 2021-07-13 MED ORDER — LACTATED RINGERS IV SOLN: INTRAVENOUS | Status: DC

## 2021-07-13 MED ORDER — ONDANSETRON HCL 4 MG PO TABS: 4.0000 mg | ORAL_TABLET | Freq: Four times a day (QID) | ORAL | Status: DC | PRN

## 2021-07-13 MED ORDER — GABAPENTIN 300 MG PO CAPS
ORAL_CAPSULE | ORAL | Status: AC
  Administered 2021-07-13: 900 mg via ORAL
  Filled 2021-07-13: qty 3

## 2021-07-13 MED ORDER — PROPOFOL 10 MG/ML IV BOLUS
INTRAVENOUS | Status: AC
  Filled 2021-07-13: qty 20

## 2021-07-13 MED ORDER — SUCCINYLCHOLINE CHLORIDE 200 MG/10ML IV SOSY
PREFILLED_SYRINGE | INTRAVENOUS | Status: DC | PRN
  Administered 2021-07-13: 100 mg via INTRAVENOUS

## 2021-07-13 MED ORDER — OXYCODONE HCL 5 MG PO TABS: 5.0000 mg | ORAL_TABLET | Freq: Once | ORAL | Status: AC | PRN

## 2021-07-13 MED ORDER — SODIUM CHLORIDE 0.9 % IV SOLN: INTRAVENOUS | Status: DC

## 2021-07-13 MED ORDER — REMIFENTANIL HCL 1 MG IV SOLR
INTRAVENOUS | Status: AC
  Filled 2021-07-13: qty 1000

## 2021-07-13 MED ORDER — MIDAZOLAM HCL 2 MG/2ML IJ SOLN
INTRAMUSCULAR | Status: DC | PRN
  Administered 2021-07-13: 2 mg via INTRAVENOUS

## 2021-07-13 MED ORDER — LIDOCAINE HCL (CARDIAC) PF 100 MG/5ML IV SOSY
PREFILLED_SYRINGE | INTRAVENOUS | Status: DC | PRN
  Administered 2021-07-13: 100 mg via INTRAVENOUS

## 2021-07-13 MED ORDER — OXYCODONE HCL 5 MG PO TABS
ORAL_TABLET | ORAL | Status: AC
  Administered 2021-07-14: 5 mg via ORAL
  Filled 2021-07-13: qty 1

## 2021-07-13 MED ORDER — ONDANSETRON HCL 4 MG/2ML IJ SOLN
4.0000 mg | Freq: Once | INTRAMUSCULAR | Status: AC | PRN
  Administered 2021-07-13: 4 mg via INTRAVENOUS

## 2021-07-13 MED ORDER — CITALOPRAM HYDROBROMIDE 20 MG PO TABS
30.0000 mg | ORAL_TABLET | Freq: Every day | ORAL | Status: DC
  Administered 2021-07-13: 30 mg via ORAL
  Filled 2021-07-13: qty 1

## 2021-07-13 MED ORDER — ONDANSETRON HCL 4 MG/2ML IJ SOLN: 4.0000 mg | Freq: Four times a day (QID) | INTRAMUSCULAR | Status: DC | PRN

## 2021-07-13 MED ORDER — ORAL CARE MOUTH RINSE: 15.0000 mL | Freq: Once | OROMUCOSAL | Status: AC

## 2021-07-13 MED ORDER — ALBUTEROL SULFATE HFA 108 (90 BASE) MCG/ACT IN AERS
INHALATION_SPRAY | RESPIRATORY_TRACT | Status: DC | PRN
  Administered 2021-07-13 (×2): 6 via RESPIRATORY_TRACT

## 2021-07-13 MED ORDER — SODIUM CHLORIDE 0.9% FLUSH: 3.0000 mL | INTRAVENOUS | Status: DC | PRN

## 2021-07-13 MED ORDER — CHLORHEXIDINE GLUCONATE 0.12 % MT SOLN
OROMUCOSAL | Status: AC
  Filled 2021-07-13: qty 15

## 2021-07-13 MED ORDER — ROSUVASTATIN CALCIUM 5 MG PO TABS
5.0000 mg | ORAL_TABLET | Freq: Every day | ORAL | Status: DC
  Filled 2021-07-13: qty 1

## 2021-07-13 MED ORDER — MIDAZOLAM HCL 2 MG/2ML IJ SOLN
INTRAMUSCULAR | Status: AC
  Filled 2021-07-13: qty 2

## 2021-07-13 MED ORDER — INSULIN ASPART 100 UNIT/ML IJ SOLN
INTRAMUSCULAR | Status: AC
  Administered 2021-07-13: 5 [IU] via SUBCUTANEOUS
  Filled 2021-07-13: qty 1

## 2021-07-13 MED ORDER — FENTANYL CITRATE (PF) 100 MCG/2ML IJ SOLN
INTRAMUSCULAR | Status: AC
  Administered 2021-07-13: 25 ug via INTRAVENOUS
  Filled 2021-07-13: qty 2

## 2021-07-13 MED ORDER — SENNA 8.6 MG PO TABS
1.0000 | ORAL_TABLET | Freq: Two times a day (BID) | ORAL | Status: DC
  Administered 2021-07-13 – 2021-07-14 (×2): 8.6 mg via ORAL
  Filled 2021-07-13 (×3): qty 1

## 2021-07-13 MED ORDER — PHENYLEPHRINE HCL-NACL 20-0.9 MG/250ML-% IV SOLN
INTRAVENOUS | Status: DC | PRN
  Administered 2021-07-13: 50 ug/min via INTRAVENOUS

## 2021-07-13 MED ORDER — EPHEDRINE SULFATE 50 MG/ML IJ SOLN
INTRAMUSCULAR | Status: DC | PRN
  Administered 2021-07-13: 10 mg via INTRAVENOUS

## 2021-07-13 MED ORDER — OXYCODONE HCL 5 MG PO TABS
5.0000 mg | ORAL_TABLET | ORAL | Status: DC | PRN
  Administered 2021-07-13: 5 mg via ORAL

## 2021-07-13 MED ORDER — BUPIVACAINE-EPINEPHRINE (PF) 0.5% -1:200000 IJ SOLN
INTRAMUSCULAR | Status: DC | PRN
  Administered 2021-07-13: 9 mL

## 2021-07-13 MED ORDER — METFORMIN HCL 500 MG PO TABS
1000.0000 mg | ORAL_TABLET | Freq: Two times a day (BID) | ORAL | Status: DC
  Administered 2021-07-14: 1000 mg via ORAL
  Filled 2021-07-13 (×2): qty 2

## 2021-07-13 MED ORDER — BUPIVACAINE-EPINEPHRINE (PF) 0.5% -1:200000 IJ SOLN
INTRAMUSCULAR | Status: AC
  Filled 2021-07-13: qty 30

## 2021-07-13 MED ORDER — CHLORHEXIDINE GLUCONATE 0.12 % MT SOLN
15.0000 mL | Freq: Once | OROMUCOSAL | Status: AC
  Administered 2021-07-13: 15 mL via OROMUCOSAL

## 2021-07-13 MED ORDER — CYCLOBENZAPRINE HCL 10 MG PO TABS
10.0000 mg | ORAL_TABLET | Freq: Two times a day (BID) | ORAL | Status: DC | PRN
  Administered 2021-07-14: 10 mg via ORAL
  Filled 2021-07-13 (×2): qty 1

## 2021-07-13 MED ORDER — ACETAMINOPHEN 10 MG/ML IV SOLN
INTRAVENOUS | Status: AC
  Administered 2021-07-13: 1000 mg via INTRAVENOUS
  Filled 2021-07-13: qty 100

## 2021-07-13 MED ORDER — INSULIN ASPART 100 UNIT/ML IJ SOLN: 0.0000 [IU] | Freq: Three times a day (TID) | INTRAMUSCULAR | Status: DC

## 2021-07-13 MED ORDER — MENTHOL 3 MG MT LOZG
1.0000 | LOZENGE | OROMUCOSAL | Status: DC | PRN
  Filled 2021-07-13: qty 9

## 2021-07-13 MED ORDER — LORATADINE 10 MG PO TABS
10.0000 mg | ORAL_TABLET | Freq: Every day | ORAL | Status: DC
  Administered 2021-07-14: 10 mg via ORAL
  Filled 2021-07-13: qty 1

## 2021-07-13 MED ORDER — ACETAMINOPHEN 650 MG RE SUPP
650.0000 mg | RECTAL | Status: DC | PRN
  Filled 2021-07-13: qty 1

## 2021-07-13 MED ORDER — HYDROMORPHONE HCL 1 MG/ML IJ SOLN
1.0000 mg | INTRAMUSCULAR | Status: DC | PRN
  Administered 2021-07-13 – 2021-07-14 (×4): 1 mg via INTRAVENOUS

## 2021-07-13 MED ORDER — GABAPENTIN 300 MG PO CAPS
900.0000 mg | ORAL_CAPSULE | Freq: Two times a day (BID) | ORAL | Status: DC
  Administered 2021-07-14: 900 mg via ORAL

## 2021-07-13 MED ORDER — HYDROCHLOROTHIAZIDE 12.5 MG PO TABS
12.5000 mg | ORAL_TABLET | Freq: Every day | ORAL | Status: DC
  Administered 2021-07-14: 12.5 mg via ORAL
  Filled 2021-07-13: qty 1

## 2021-07-13 MED ORDER — 0.9 % SODIUM CHLORIDE (POUR BTL) OPTIME
TOPICAL | Status: DC | PRN
  Administered 2021-07-13: 1000 mL

## 2021-07-13 MED ORDER — THROMBIN 5000 UNITS EX SOLR
CUTANEOUS | Status: AC
  Filled 2021-07-13: qty 5000

## 2021-07-13 MED ORDER — PROPOFOL 10 MG/ML IV BOLUS
INTRAVENOUS | Status: DC | PRN
  Administered 2021-07-13: 200 mg via INTRAVENOUS

## 2021-07-13 MED ORDER — LACTATED RINGERS IV SOLN: INTRAVENOUS | Status: DC | PRN

## 2021-07-13 MED ORDER — OXYCODONE HCL 5 MG/5ML PO SOLN: 5.0000 mg | Freq: Once | ORAL | Status: AC | PRN

## 2021-07-13 MED ORDER — PHENYLEPHRINE HCL (PRESSORS) 10 MG/ML IV SOLN
INTRAVENOUS | Status: DC | PRN
  Administered 2021-07-13: 80 ug via INTRAVENOUS
  Administered 2021-07-13: 160 ug via INTRAVENOUS

## 2021-07-13 MED ORDER — DEXAMETHASONE SODIUM PHOSPHATE 10 MG/ML IJ SOLN
INTRAMUSCULAR | Status: DC | PRN
  Administered 2021-07-13: 10 mg via INTRAVENOUS

## 2021-07-13 MED ORDER — CEFAZOLIN SODIUM-DEXTROSE 2-4 GM/100ML-% IV SOLN
2.0000 g | INTRAVENOUS | Status: AC
  Administered 2021-07-13: 2 g via INTRAVENOUS

## 2021-07-13 MED ORDER — FENTANYL CITRATE (PF) 100 MCG/2ML IJ SOLN
INTRAMUSCULAR | Status: DC | PRN
  Administered 2021-07-13: 100 ug via INTRAVENOUS

## 2021-07-13 MED ORDER — PHENOL 1.4 % MT LIQD
1.0000 | OROMUCOSAL | Status: DC | PRN
  Filled 2021-07-13: qty 177

## 2021-07-13 MED ORDER — GLYCOPYRROLATE 0.2 MG/ML IJ SOLN
INTRAMUSCULAR | Status: DC | PRN
  Administered 2021-07-13: .2 mg via INTRAVENOUS

## 2021-07-13 SURGICAL SUPPLY — 63 items
BIT DRILL 13 (BIT) IMPLANT
BLADE BOVIE TIP EXT 4 (BLADE) ×2 IMPLANT
BLADE SURG 15 STRL LF DISP TIS (BLADE) ×1 IMPLANT
BLADE SURG 15 STRL SS (BLADE) ×1
BUR DIAMOND COARSE 4.0 RND (BURR) IMPLANT
BUR NEURO DRILL SOFT 3.0X3.8M (BURR) ×2 IMPLANT
CHLORAPREP W/TINT 26 (MISCELLANEOUS) ×4 IMPLANT
COUNTER NEEDLE 20/40 LG (NEEDLE) ×2 IMPLANT
COVER LIGHT HANDLE STERIS (MISCELLANEOUS) ×4 IMPLANT
CUP MEDICINE 2OZ PLAST GRAD ST (MISCELLANEOUS) ×4 IMPLANT
DERMABOND ADVANCED (GAUZE/BANDAGES/DRESSINGS) ×1
DERMABOND ADVANCED .7 DNX12 (GAUZE/BANDAGES/DRESSINGS) ×1 IMPLANT
DRAPE C-ARM 42X72 X-RAY (DRAPES) ×4 IMPLANT
DRAPE MICROSCOPE SPINE 48X150 (DRAPES) ×2 IMPLANT
DRAPE THYROID T SHEET (DRAPES) ×2 IMPLANT
DRSG OPSITE POSTOP 3X4 (GAUZE/BANDAGES/DRESSINGS) ×2 IMPLANT
ELECT CAUTERY BLADE TIP 2.5 (TIP) ×2
ELECT EZSTD 165MM 6.5IN (MISCELLANEOUS) ×2
ELECTRODE CAUTERY BLDE TIP 2.5 (TIP) ×1 IMPLANT
ELECTRODE EZSTD 165MM 6.5IN (MISCELLANEOUS) ×1 IMPLANT
FEE INTRAOP CADWELL SUPPLY NCS (MISCELLANEOUS) ×1 IMPLANT
FEE INTRAOP MONITOR IMPULS NCS (MISCELLANEOUS) IMPLANT
GAUZE 4X4 16PLY ~~LOC~~+RFID DBL (SPONGE) ×2 IMPLANT
GAUZE SPONGE 4X4 12PLY STRL (GAUZE/BANDAGES/DRESSINGS) ×2 IMPLANT
GLOVE SRG 8 PF TXTR STRL LF DI (GLOVE) ×1 IMPLANT
GLOVE SURG SYN 6.5 ES PF (GLOVE) ×4 IMPLANT
GLOVE SURG SYN 8.0 (GLOVE) ×4 IMPLANT
GLOVE SURG UNDER POLY LF SZ6.5 (GLOVE) ×4 IMPLANT
GLOVE SURG UNDER POLY LF SZ8 (GLOVE) ×1
GOWN SRG LRG LVL 4 IMPRV REINF (GOWNS) ×2 IMPLANT
GOWN STRL REIN LRG LVL4 (GOWNS) ×2
GOWN STRL REUS W/ TWL XL LVL3 (GOWN DISPOSABLE) ×2 IMPLANT
GOWN STRL REUS W/TWL XL LVL3 (GOWN DISPOSABLE) ×2
GRADUATE 1200CC STRL 31836 (MISCELLANEOUS) ×2 IMPLANT
INTRAOP CADWELL SUPPLY FEE NCS (MISCELLANEOUS) ×1
INTRAOP DISP SUPPLY FEE NCS (MISCELLANEOUS) ×1
INTRAOP MONITOR FEE IMPULS NCS (MISCELLANEOUS)
INTRAOP MONITOR FEE IMPULSE (MISCELLANEOUS)
KIT TURNOVER KIT A (KITS) ×2 IMPLANT
MANIFOLD NEPTUNE II (INSTRUMENTS) ×2 IMPLANT
MARKER SKIN DUAL TIP RULER LAB (MISCELLANEOUS) ×4 IMPLANT
NEEDLE HYPO 22GX1.5 SAFETY (NEEDLE) ×2 IMPLANT
NEEDLE SPNL 22GX3.5 QUINCKE BK (NEEDLE) ×2 IMPLANT
NS IRRIG 1000ML POUR BTL (IV SOLUTION) ×2 IMPLANT
PACK LAMINECTOMY NEURO (CUSTOM PROCEDURE TRAY) ×2 IMPLANT
PAD ARMBOARD 7.5X6 YLW CONV (MISCELLANEOUS) ×4 IMPLANT
PENCIL ELECTRO HAND CTR (MISCELLANEOUS) ×2 IMPLANT
PIN CASPAR 14 (PIN) ×1 IMPLANT
PIN CASPAR 14MM (PIN) ×2
PLATE ZEVO 2LVL 37MM (Plate) ×2 IMPLANT
SCREW 3.5 SELFDRILL 15MM VARI (Screw) ×12 IMPLANT
SPACER BONE CORNERSTONE 6X14 (Orthopedic Implant) ×2 IMPLANT
SPACER BONE CORNERSTONE 7X14 (Orthopedic Implant) ×2 IMPLANT
SPONGE KITTNER 5P (MISCELLANEOUS) ×2 IMPLANT
SURGIFLO W/THROMBIN 8M KIT (HEMOSTASIS) ×2 IMPLANT
SUT POLYSORB 2-0 5X18 GS-10 (SUTURE) ×4 IMPLANT
SUT VICRYL 3-0 CR8 SH (SUTURE) ×2 IMPLANT
SYR 30ML LL (SYRINGE) ×2 IMPLANT
TAPE CLOTH 3X10 WHT NS LF (GAUZE/BANDAGES/DRESSINGS) ×2 IMPLANT
TOWEL OR 17X26 4PK STRL BLUE (TOWEL DISPOSABLE) ×4 IMPLANT
TRAY FOLEY MTR SLVR 16FR STAT (SET/KITS/TRAYS/PACK) ×2 IMPLANT
TUBING CONNECTING 10 (TUBING) ×2 IMPLANT
WATER STERILE IRR 500ML POUR (IV SOLUTION) ×2 IMPLANT

## 2021-07-13 NOTE — Anesthesia Preprocedure Evaluation (Addendum)
Anesthesia Evaluation  Patient identified by MRN, date of birth, ID band Patient awake    Reviewed: Allergy & Precautions, NPO status , Patient's Chart, lab work & pertinent test results  History of Anesthesia Complications Negative for: history of anesthetic complications  Airway Mallampati: III   Neck ROM: Full    Dental no notable dental hx.    Pulmonary asthma ,    Pulmonary exam normal breath sounds clear to auscultation       Cardiovascular hypertension, Normal cardiovascular exam Rhythm:Regular Rate:Normal  ECG 07/02/21:  Normal sinus rhythm Minimal voltage criteria for LVH, may be normal variant ( Cornell product ) Prolonged QT  Echo 02/08/20:  NORMAL LEFT VENTRICULAR SYSTOLIC FUNCTION  NORMAL RIGHT VENTRICULAR SYSTOLIC FUNCTION  TRIVIAL REGURGITATION NOTED  NO VALVULAR STENOSIS     Neuro/Psych PSYCHIATRIC DISORDERS Anxiety negative neurological ROS     GI/Hepatic GERD  ,  Endo/Other  diabetes, Type 2Class 3 obesity  Renal/GU negative Renal ROS     Musculoskeletal   Abdominal   Peds  Hematology negative hematology ROS (+)   Anesthesia Other Findings   Reproductive/Obstetrics                            Anesthesia Physical Anesthesia Plan  ASA: 3  Anesthesia Plan: General   Post-op Pain Management:    Induction: Intravenous  PONV Risk Score and Plan: 3 and Ondansetron, Dexamethasone and Treatment may vary due to age or medical condition  Airway Management Planned: Oral ETT  Additional Equipment:   Intra-op Plan:   Post-operative Plan: Extubation in OR  Informed Consent: I have reviewed the patients History and Physical, chart, labs and discussed the procedure including the risks, benefits and alternatives for the proposed anesthesia with the patient or authorized representative who has indicated his/her understanding and acceptance.     Dental advisory  given  Plan Discussed with: CRNA  Anesthesia Plan Comments: (Patient consented for risks of anesthesia including but not limited to:  - adverse reactions to medications - damage to eyes, teeth, lips or other oral mucosa - nerve damage due to positioning  - sore throat or hoarseness - Damage to heart, brain, nerves, lungs, other parts of body or loss of life  Patient voiced understanding.)        Anesthesia Quick Evaluation

## 2021-07-13 NOTE — Transfer of Care (Signed)
Immediate Anesthesia Transfer of Care Note  Patient: Tina Henderson  Procedure(s) Performed: C5-6, C6-7 ANTERIOR CERVICAL DECOMPRESSION/DISCECTOMY FUSION 2 LEVELS  Patient Location: PACU  Anesthesia Type:General  Level of Consciousness: awake, alert  and oriented  Airway & Oxygen Therapy: Patient Spontanous Breathing and Patient connected to face mask oxygen  Post-op Assessment: Report given to RN and Post -op Vital signs reviewed and stable  Post vital signs: Reviewed and stable    Last Vitals:  Vitals Value Taken Time  BP 138/84 07/13/21 1330  Temp    Pulse 106 07/13/21 1342  Resp    SpO2 97 % 07/13/21 1342  Vitals shown include unvalidated device data.  Last Pain:  Vitals:   07/13/21 1013  TempSrc: Oral  PainSc: 0-No pain         Complications: No notable events documented.

## 2021-07-13 NOTE — Op Note (Signed)
Operative Note   SURGERY DATE:  07/13/2021   PRE-OP DIAGNOSIS: Cervical spinal stenosis with radiculopathy   POST-OP DIAGNOSIS: Post-Op Diagnosis Codes: Cervical spinal stenosis with radiculopathy   Procedure(s) with comments: C5/6, C6/7 Anterior cervical discectomy and fusion   SURGEON:     * Malen Gauze, MD       Liliane Bade, PA Assistant   ANESTHESIA: General    OPERATIVE FINDINGS: Disc herniation and stenosis at C5/6, C6/7   Procedure Indications Ms. Topham presented to our clinic on 9/27 with ongoing left arm pain and numbness. She failed conservative management including medications, steroid injection, and physical therapy.  She had a MRI that showed disc herniations causing stenosis on the left at C5/6 and C6/7. Given this, we recommended an anterior cervical decompression and fusion to relieve the pressure on the nerves. The risks of hematoma, infection, poor bone healing and failure of fusion, cord injury, weakness, numbness, neck pain, stroke, and death were discussed in detail. All questions were answered and the patient elected to proceed with the surgery.     Procedure After obtaining informed consent, the patient was taken to the Operating Room where general anesthesia was induced and the patient intubated. Vascular access was obtained. Decadron was administered. The head was slightly extended and imaging used to identify a skin crease overlying the C6 vertebral body.     The patient was prepped and draped in the usual sterile fashion and a timeout was performed per protocol. Local anesthesia was instilled with epinephrine along the planned incision site. A transverse cervical incision was performed on the left in a skin crease. The incision was carried to the level of the platysma and then cautery was used to incise the muscle. Blunt dissection was used to expand the plane and the dissection was carried deep medial to the SCM and carotid sheath being careful to  identify the trachea and esophagus medially. The prevertebral fascia was identified and this was bluntly dissected to expose the disc spaces. A needle was placed in the disc space and x-ray confirmed the C5/6 disc level.     Next, cautery was used to undermine the longus colli muscles bilaterally and identify the C5/6 and C6/7 disc spaces. Caspar pins were placed at C5 and C7 and a retractor system placed under the muscles to complete the exposure. Next, a combination of curettes and Kerrison rongeurs were used to remove the anterior osteophyte at C5/6 and then the disc material.   Once it was clear down to the posterior osteophyte, we moved to the C6/7 level and performed in a similar opening where the anterior osteophyte was removed followed by the disc.  At this level, there was less prominent disc material but more space collapse.  This was also removed with curettes until the posterior osteophyte was identified.   The microscope was brought into the field for the remainder of the surgery. The 24mm diamond drill was used to remove the osteophyte/disc complex deep to the level of the PLL at C6/7 first. There was a disc protrusion at this level that was removed with rongeurs. The PLL was entered with a hook and then the PLL was removed along with remaining disc material to decompress centrally and then out into bilateral neuro foramen. There was a small firm adherent piece of the ligament  and this remained. A blunt probe was used to confirm no residual stenosis laterally and a curette used to ensure no posterior osteophyte remained. Floseal was used for  hemostasis.   Next we moved to C6/7 disc space where a similar maneuver was performed and the drill was used to first shave the adjacent vertebral bodies in order to widen the disc base as it was collapsed.  Once we were at the posterior osteophyte, this was removed until disc material was seen The disc was removed and PLL was entered with a hook and then the  PLL was removed along with remaining disc material to decompress centrally and then out into bilateral neuro foramen. A blunt probe was used to confirm no residual stenosis laterally and a curette used to ensure no posterior osteophyte remained.  Floseal was used for hemostasis.    Next, we used trial spacers to determine appropriate heights of grafts.  Allograft was placed, 15mm in height at C5/6 and 6 mm in height at C6/7 and placed slightly recessed to the anterior edge of vertebral body. X-ray confirmed good placement of grafts.  The caspar pins were removed and bone wax placed. The remainder of the osteophytes were removed to allow for plating. Next, a 37 mm plate was found to be the adequate size and was placed in the midline and secured with two 48mm screws at each bone level. Xray was obtained confirming good graft placement and adequate depth of screws. The retractors were removed. The wound was irrigated copiously and hemostasis obtained. The platysma was closed with 2-0 vicryl suture. The dermis was closed with 3-0 Vicryl and Dermabond was placed on the skin.    The patient had general anesthesia reversed and was extubated following the procedure.  She awoke following commands with symmetric movement.  She was taken to the PACU where she continued recovery and then the ward.        ESTIMATED BLOOD LOSS:   20 cc   SPECIMENS None   IMPLANT   Lordotic ASR Cortical-Cancellous Block 7x14x85mm  Inventory Item:  Serial no.: [id number] Model/Cat no.: [id number]  Implant name: Lordotic ASR Cortical-Cancellous Block 7x14x46mm Laterality: N/A Area: Spine Cervical  Manufacturer: [place] Date of Manufacture:    Action: Implanted Number Used: 1   Device Identifier:  Device Identifier Type:     CORNERSTONE [id number] LORDOTI - [id number]  Inventory Item: CORNERSTONE [id number] LORDOTI Serial no.: [id number] Model/Cat no.: [id number]  Implant name: CORNERSTONE [id number] LORDOTI -  [id number] Laterality: N/A Area: Spine Cervical  Manufacturer: [employer] Roni Bread Date of Manufacture:    Action: Implanted Number Used: 1   Device Identifier:  Device Identifier Type:     PLATE ZEVO 2LVL 33IR - [id number]  Inventory Item: PLATE ZEVO 2LVL 63KZ Serial no.:  Model/Cat no.: [id number]  Implant name: Erskine Squibb 60FU - [id number] Laterality: N/A Area: Spine Cervical  Manufacturer: MEDTRONIC Amado Nash [facility] Date of Manufacture:    Action: Implanted Number Used: 1   Device Identifier:  Device Identifier Type:     SCREW 3.5 SELFDRILL 15MM VARI - [id number]  Inventory Item: SCREW 3.5 SELFDRILL 15MM VARI Serial no.:  Model/Cat no.: [id number]  Implant name: SCREW 3.5 SELFDRILL 15MM VARI - [id number] Laterality: N/A Area: Spine Cervical  Manufacturer: [employer]TRON[employer] Roni Bread Date of Manufacture:    Action: Implanted Number Used: 6   Device Identifier:  Device Identifier Type:       I performed the case in its entirety with assistance of PA, [facility]   [name], [place]

## 2021-07-13 NOTE — Interval H&P Note (Signed)
History and Physical Interval Note:  07/13/2021 10:01 AM  Tina Henderson  has presented today for surgery, with the diagnosis of cervical radiculopathy m54.12.  The various methods of treatment have been discussed with the patient and family. After consideration of risks, benefits and other options for treatment, the patient has consented to  Procedure(s): C5-6, C6-7 ANTERIOR CERVICAL DECOMPRESSION/DISCECTOMY FUSION 2 LEVELS (N/A) as a surgical intervention.  The patient's history has been reviewed, patient examined, no change in status, stable for surgery.  I have reviewed the patient's chart and labs.  Questions were answered to the patient's satisfaction.     [name]

## 2021-07-13 NOTE — Anesthesia Procedure Notes (Signed)
Procedure Name: Intubation Date/Time: 07/13/2021 10:41 AM Performed by: Rona Ravens, CRNA Pre-anesthesia Checklist: Patient identified, Patient being monitored, Timeout performed, Emergency Drugs available and Suction available Patient Re-evaluated:Patient Re-evaluated prior to induction Oxygen Delivery Method: Circle system utilized Preoxygenation: Pre-oxygenation with 100% oxygen Induction Type: IV induction Ventilation: Mask ventilation without difficulty Laryngoscope Size: 3 and McGraph Grade View: Grade I Tube type: Oral Tube size: 6.5 mm Number of attempts: 1 Airway Equipment and Method: Stylet Placement Confirmation: ETT inserted through vocal cords under direct vision, positive ETCO2 and breath sounds checked- equal and bilateral Secured at: 21 cm Tube secured with: Tape Dental Injury: Teeth and Oropharynx as per pre-operative assessment

## 2021-07-13 NOTE — Progress Notes (Signed)
Patient ambulated to the bathroom with standby assist x2. This is the patient's first time getting up and ambulating.

## 2021-07-13 NOTE — Progress Notes (Signed)
Pharmacy Antibiotic Note  Tina Henderson is a 50 y.o. female admitted on (Not on file) with surgical prophylaxis.  Pharmacy has been consulted for Cefazolin dosing.  TBW = 112.9 kg   Plan: Cefazolin 2 gm IV X 1 60 min pre-op ordered for 10/31 @ 0500.      No data recorded.  No results for input(s): WBC, CREATININE, LATICACIDVEN, VANCOTROUGH, VANCOPEAK, VANCORANDOM, GENTTROUGH, GENTPEAK, GENTRANDOM, TOBRATROUGH, TOBRAPEAK, TOBRARND, AMIKACINPEAK, AMIKACINTROU, AMIKACIN in the last 168 hours.  Estimated Creatinine Clearance: 101.7 mL/min (by C-G formula based on SCr of 0.49 mg/dL).    Allergies  Allergen Reactions   Bee Venom Swelling    Antimicrobials this admission:   >>    >>   Dose adjustments this admission:   Microbiology results:  BCx:   UCx:    Sputum:    MRSA PCR:   Thank you for allowing pharmacy to be a part of this patient's care.  Mallorie Norrod D 07/13/2021 12:49 AM

## 2021-07-13 NOTE — Progress Notes (Signed)
Instructions on proper use of incentive spirometer given. Patient was able to correctly demonstrate proper use of incentive spirometer X10 as instructed. Call bell in reach. Bed low and locked. Patient is stable at this time.

## 2021-07-13 NOTE — H&P (Signed)
Tina Henderson is an 50 y.o. female.   Chief Complaint: Left arm pain HPI: Tina Henderson is here for evaluation of left arm tingling numbness that she says started in the past year. She does feel like the pain is been more recent. She did go to physical therapy in the last few months and did not experience any relief from this. In fact, she feels this may have made it worse. She did go for an injections in the neck and also did not get any relief from these. She has noticed that the pain will be mostly on the left side of the neck going down the arm into the lateral hand and thumb. She does note some numbness and tingling sensation in the thumb on that side. She denies any right-sided symptoms. She is right-handed. 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 She has never used smokeless tobacco. She reports that she does not drink alcohol and does not use drugs.  Allergies:  Allergies  Allergen Reactions   Bee Venom Swelling    Medications Prior to Admission  Medication Sig Dispense Refill   acetaminophen (TYLENOL) 500 MG tablet Take 1,500 mg by mouth 2 (two) times daily as needed for headache or moderate pain.     ALPRAZolam (XANAX) 0.5 MG tablet Take 1 tablet (0.5 mg total) by mouth 3 (three) times daily as needed. 60 tablet 0   citalopram (CELEXA) 20 MG tablet TAKE 1  AND 1/2 TABLETS DAILY BY MOUTH 45 tablet 0   cyclobenzaprine (FLEXERIL) 10 MG tablet Take 10 mg by mouth 2 (two) times daily as needed for muscle spasms.     fluticasone (FLONASE) 50 MCG/ACT nasal spray SPRAY 2 SPRAYS INTO EACH NOSTRIL EVERY DAY (Patient taking differently: Place 2 sprays into both nostrils at bedtime.) 16 g 0   gabapentin (NEURONTIN) 300 MG capsule Take 900 mg by mouth 2 (two) times daily.     loratadine (CLARITIN) 10 MG tablet Take 10 mg by mouth daily.     losartan-hydrochlorothiazide (HYZAAR) 50-12.5 MG tablet Take 1 tablet by mouth daily.     metFORMIN (GLUCOPHAGE) 1000 MG tablet Take 1,000 mg by mouth 2 (two) times daily.     pantoprazole (PROTONIX) 40 MG tablet Take 1 tablet (40 mg total) by mouth daily. 30 tablet 1   rosuvastatin (CRESTOR) 10 MG tablet Take 5 mg by mouth daily.      Results for orders placed or performed during the hospital encounter of 07/13/21 (from the past 48 hour(s))  Glucose, capillary     Status: Abnormal   Collection Time: 07/13/21  9:42 AM  Result Value Ref Range   Glucose-Capillary 121 (H) 70 - 99 mg/dL    Comment: Glucose  reference range applies only to samples taken after fasting for at least 8 hours.   No results found.  Review of Systems General ROS: Negative Psychological ROS: Negative Ophthalmic ROS: Negative ENT ROS: Negative Hematological and Lymphatic ROS: Negative  Endocrine ROS: Negative Respiratory ROS: Negative Cardiovascular ROS: Negative Gastrointestinal ROS: Negative Genito-Urinary ROS: Negative Musculoskeletal ROS: Positive for neck pain Neurological ROS: Positive for pain, numbness Dermatological ROS: Negative There were no vitals taken for this visit. Physical Exam  Head: Normocephalic, atraumatic Eyes: Normal, EOM intact Oropharynx: Wearing facemask CV: Regular rate Pulm: Clear to auscultation Neck: Supple, no tenderness to palpation, range of motion appears full Ext: No edema in LE  bilaterally  Neurologic exam:  Mental status: alertness: alert, affect: normal Speech: fluent and clear Motor:strength symmetric 5/5 in bilateral upper and lower extremities Sensory: intact to light touch in all extremities with exception of decreased over the lateral first 2 fingers and hand on the left Gait: normal     Imaging: MRI cervical spine: There is some straightening of the lordotic curvature. There is some mild degenerative disease noted in the mid cervical spine. There is prominent disc bulging noted at C5-6 and C6-7. This does cause central stenosis as well as bilateral foraminal stenosis. The stenosis is most severe at C6-7. There is normal alignment.  Assessment/Plan Proceed with C5-7 ACDF  [name], MD 07/13/2021, 9:59 AM

## 2021-07-13 NOTE — Anesthesia Postprocedure Evaluation (Signed)
Anesthesia Post Note  Patient: Tina Henderson  Procedure(s) Performed: C5-6, C6-7 ANTERIOR CERVICAL DECOMPRESSION/DISCECTOMY FUSION 2 LEVELS  Patient location during evaluation: PACU Anesthesia Type: General Level of consciousness: awake and alert, oriented and patient cooperative Pain management: pain level controlled Vital Signs Assessment: post-procedure vital signs reviewed and stable Respiratory status: spontaneous breathing, nonlabored ventilation and respiratory function stable Cardiovascular status: blood pressure returned to baseline and stable Postop Assessment: adequate PO intake Anesthetic complications: no   No notable events documented.   Last Vitals:  Vitals:   07/13/21 1400 07/13/21 1415  BP: 140/90   Pulse: (!) 107   Resp: (!) 21   Temp:    SpO2: 94% 91%    Last Pain:  Vitals:   07/13/21 1013  TempSrc: Oral  PainSc: 0-No pain                 Darrin Nipper

## 2021-07-14 ENCOUNTER — Observation Stay: Payer: BC Managed Care – PPO

## 2021-07-14 DIAGNOSIS — M4802 Spinal stenosis, cervical region: Secondary | ICD-10-CM | POA: Diagnosis not present

## 2021-07-14 LAB — GLUCOSE, CAPILLARY: Glucose-Capillary: 186 mg/dL — ABNORMAL HIGH (ref 70–99)

## 2021-07-14 MED ORDER — SENNA 8.6 MG PO TABS: 1.0000 | ORAL_TABLET | Freq: Every day | ORAL | 0 refills | Status: DC | PRN

## 2021-07-14 MED ORDER — HYDROMORPHONE HCL 1 MG/ML IJ SOLN
INTRAMUSCULAR | Status: AC
  Filled 2021-07-14: qty 1

## 2021-07-14 MED ORDER — GABAPENTIN 300 MG PO CAPS
ORAL_CAPSULE | ORAL | Status: AC
  Filled 2021-07-14: qty 1

## 2021-07-14 MED ORDER — OXYCODONE HCL 5 MG PO TABS
ORAL_TABLET | ORAL | Status: AC
  Filled 2021-07-14: qty 1

## 2021-07-14 MED ORDER — INSULIN ASPART 100 UNIT/ML IJ SOLN
INTRAMUSCULAR | Status: AC
  Administered 2021-07-14: 3 [IU] via SUBCUTANEOUS
  Filled 2021-07-14: qty 1

## 2021-07-14 MED ORDER — OXYCODONE HCL 5 MG PO TABS: 5.0000 mg | ORAL_TABLET | ORAL | 0 refills | Status: AC | PRN

## 2021-07-14 NOTE — Progress Notes (Signed)
    Attending Progress Note  History: Tina Henderson is s/p C5-7 ACDF  Physical Exam: Vitals:   07/14/21 0000 07/14/21 0400  BP: (!) 151/89 133/79  Pulse: (!) 110 100  Resp: 16 16  Temp: (!) 97.1 F (36.2 C) 98.4 F (36.9 C)  SpO2: 94% 94%    AA Ox3 CNI  Strength:5/5 throughout   Data:  No results for input(s): NA, K, CL, CO2, BUN, CREATININE, LABGLOM, GLUCOSE, CALCIUM in the last 168 hours. No results for input(s): AST, ALT, ALKPHOS in the last 168 hours.  Invalid input(s): TBILI   No results for input(s): WBC, HGB, HCT, PLT in the last 168 hours. No results for input(s): APTT, INR in the last 168 hours.        Assessment/Plan:  Tina Henderson is a 50 y.o s/p C5-7 ACDF  - mobilize - pain control  Cooper Render  Department of Neurosurgery

## 2021-07-14 NOTE — Discharge Summary (Signed)
Physician Discharge Summary  Patient ID: Tina Henderson MRN: [id number] DOB/AGE: Jan 21, 1971 50 y.o.  Admit date: 07/13/2021 Discharge date: 07/14/2021  Admission Diagnoses: cervical stenosis and radiculopathy  Discharge Diagnoses:  Active Problems:   Cervical radiculopathy   Discharged Condition: good  Hospital Course: Tina Henderson is a 50 y.o presenting with cervical stenosis and radiculopathy s/p C5-7 ACDF. Her interoperative course was uncomplicated and she was admitted overnight for observation. She was discharged home after tolerating PO intake, ambulating and urinating.   Consults: None  Significant Diagnostic Studies: none  Treatments: surgery: As above. Please see separately dictated operative report for further details.   Discharge Exam: Blood pressure 133/79, pulse 100, temperature 98.4 F (36.9 C), temperature source Temporal, resp. rate 16, height 5\' 3"  (1.6 m), weight 112.9 kg, SpO2 94 %. CN II-XII grossly intact 5/5 throughout Incision c/d/I with clean dressing intact  Disposition:   Discharge Instructions     Incentive spirometry RT   Complete by: As directed       Allergies as of 07/14/2021       Reactions   Bee Venom Swelling        Medication List     STOP taking these medications    acetaminophen 500 MG tablet Commonly known as: TYLENOL       TAKE these medications    ALPRAZolam 0.5 MG tablet Commonly known as: XANAX Take 1 tablet (0.5 mg total) by mouth 3 (three) times daily as needed.   citalopram 20 MG tablet Commonly known as: CELEXA TAKE 1 AND 1/2 TABLETS DAILY BY MOUTH   cyclobenzaprine 10 MG tablet Commonly known as: FLEXERIL Take 10 mg by mouth 2 (two) times daily as needed for muscle spasms.   fluticasone 50 MCG/ACT nasal spray Commonly known as: FLONASE SPRAY 2 SPRAYS INTO EACH NOSTRIL EVERY DAY What changed: See the new instructions.   gabapentin 300 MG capsule Commonly known as: NEURONTIN Take 900 mg by mouth  2 (two) times daily.   loratadine 10 MG tablet Commonly known as: CLARITIN Take 10 mg by mouth daily.   losartan-hydrochlorothiazide 50-12.5 MG tablet Commonly known as: HYZAAR Take 1 tablet by mouth daily.   metFORMIN 1000 MG tablet Commonly known as: GLUCOPHAGE Take 1,000 mg by mouth 2 (two) times daily.   oxyCODONE 5 MG immediate release tablet Commonly known as: Oxy IR/ROXICODONE Take 1 tablet (5 mg total) by mouth every 4 (four) hours as needed for severe pain ((score 4 to 6)).   pantoprazole 40 MG tablet Commonly known as: Protonix Take 1 tablet (40 mg total) by mouth daily.   rosuvastatin 10 MG tablet Commonly known as: CRESTOR Take 5 mg by mouth daily.   senna 8.6 MG Tabs tablet Commonly known as: SENOKOT Take 1 tablet (8.6 mg total) by mouth daily as needed for mild constipation.        Follow-up Information     Loleta Dicker, PA Follow up in 2 week(s).   Why: For incision check. This appoinment should already be scheduled with the [facility]. Please call with any questions or concerns. Contact information: [place] [address] [phone]                 Signed: Loleta Dicker 07/14/2021, 8:20 AM

## 2021-07-14 NOTE — TOC Transition Note (Signed)
Transition of Care [facility]) - CM/SW Discharge Note   Patient Details  Name: Tina Henderson MRN: [id number] Date of Birth: April 11, 1971  [facility]) CM/SW Contact:  Anselm Pancoast, RN Phone Number: 07/14/2021, 9:28 AM   Clinical Narrative:    No TOC needs and spouse will be transportation home. RN reports no dc order at this time however anticipate discharge before noon.    Final next level of care: Home/Self Care Barriers to Discharge: No Barriers Identified   Patient Goals and CMS Choice Patient states their goals for this hospitalization and ongoing recovery are:: eager to return home      Discharge Placement                       Discharge Plan and Services                                     Social Determinants of Health (SDOH) Interventions     Readmission Risk Interventions No flowsheet data found.

## 2021-07-14 NOTE — Discharge Instructions (Addendum)
Your surgeon has performed an operation on your cervical spine (neck) to relieve pressure on the spinal cord and/or nerves. This involved making an incision in the front of your neck and removing one or more of the discs that support your spine. Next, a small piece of bone, a titanium plate, and screws were used to fuse two or more of the vertebrae (bones) together.  The following are instructions to help in your recovery once you have been discharged from the hospital. Even if you feel well, it is important that you follow these activity guidelines. If you do not let your neck heal properly from the surgery, you can increase the chance of return of your symptoms and other complications.  * Do not take anti-inflammatory medications for 3 months after surgery (naproxen [Aleve], ibuprofen [Advil, Motrin], celecoxib [Celebrex], etc.). These medications can prevent your bones from healing properly.  Activity    No bending, lifting, or twisting ("BLT"). Avoid lifting objects heavier than 10 pounds (gallon milk jug).  Where possible, avoid household activities that involve lifting, bending, reaching, pushing, or pulling such as laundry, vacuuming, grocery shopping, and childcare. Try to arrange for help from friends and family for these activities while your back heals.  Increase physical activity slowly as tolerated.  Taking short walks is encouraged, but avoid strenuous exercise. Do not jog, run, bicycle, lift weights, or participate in any other exercises unless specifically allowed by your doctor.  Talk to your doctor before resuming sexual activity.  You should not drive until cleared by your doctor.  Until released by your doctor, you should not return to work or school.  You should rest at home and let your body heal.   You may shower three days after your surgery.  After showering, lightly dab your incision dry. Do not take a tub bath or go swimming until approved by your doctor at your  follow-up appointment.  If your doctor ordered a cervical collar (neck brace) for you, you should wear it whenever you are out of bed. You may remove it when lying down or sleeping, but you should wear it at all other times. Not all neck surgeries require a cervical collar.  If you smoke, we strongly recommend that you quit.  Smoking has been proven to interfere with normal bone healing and will dramatically reduce the success rate of your surgery. Please contact [facility] (800-QUIT-NOW) and use the resources at www.[facility].com for assistance in stopping smoking.  Surgical Incision   If you have a dressing on your incision, you may remove it two days after your surgery. Keep your incision area clean and dry.  If you have staples or stitches on your incision, you should have a follow up scheduled for removal. If you do not have staples or stitches, you will have steri-strips (small pieces of surgical tape) or Dermabond glue. The steri-strips/glue should begin to peel away within about a week (it is fine if the steri-strips fall off before then). If the strips are still in place one week after your surgery, you may gently remove them.  Diet           You may return to your usual diet. However, you may experience discomfort when swallowing in the first month after your surgery. This is normal. You may find that softer foods are more comfortable for you to swallow. Be sure to stay hydrated.  When to Contact us  You may experience pain in your neck and/or pain between your shoulder  blades. This is normal and should improve in the next few weeks with the help of pain medication, muscle relaxers, and rest. Some patients report that a warm compress on the back of the neck or between the shoulder blades helps.  However, should you experience any of the following, contact us immediately: New numbness or weakness Pain that is progressively getting worse, and is not relieved by your pain medication,  muscle relaxers, rest, and warm compresses Bleeding, redness, swelling, pain, or drainage from surgical incision Chills or flu-like symptoms Fever greater than 101.0 F (38.3 C) Inability to eat, drink fluids, or take medications Problems with bowel or bladder functions Difficulty breathing or shortness of breath Warmth, tenderness, or swelling in your calf Contact Information During office hours (Monday-Friday 9 am to 5 pm), please call your physician at [phone] and ask for Berdine Addison After hours and weekends, please call [phone] and speak with the answering service, who will contact the doctor on call.  If that fails, call the [place] Operator at [phone] and ask for the Neurosurgery Resident On Call  For a life-threatening emergency, call 911

## 2021-07-14 NOTE — TOC Initial Note (Signed)
Transition of Care [facility]) - Initial/Assessment Note    Patient Details  Name: Tina Henderson MRN: [id number] Date of Birth: 1971/01/17  [facility]) CM/SW Contact:    Anselm Pancoast, RN Phone Number: 07/14/2021, 9:25 AM  Clinical Narrative:                 Spoke to patient at bedside. Patient is eager to return home and states her spouse will be transportation and primary caregiver once home. Reports no needs for discharge and no plans for home health care. No DME needed and no issues with obtaining medication.   Expected Discharge Plan: Home/Self Care Barriers to Discharge: No Barriers Identified   Patient Goals and CMS Choice Patient states their goals for this hospitalization and ongoing recovery are:: eager to return home      Expected Discharge Plan and Services Expected Discharge Plan: Home/Self Care       Living arrangements for the past 2 months: Single Family Home Expected Discharge Date: 07/14/21                                    Prior Living Arrangements/Services Living arrangements for the past 2 months: Single Family Home Lives with:: Spouse Patient language and need for interpreter reviewed:: Yes Do you feel safe going back to the place where you live?: Yes      Need for Family Participation in Patient Care: Yes (Comment) Care giver support system in place?: Yes (comment)   Criminal Activity/Legal Involvement Pertinent to Current Situation/Hospitalization: No - Comment as needed  Activities of Daily Living Home Assistive Devices/Equipment: Eyeglasses ADL Screening (condition at time of admission) Patient's cognitive ability adequate to safely complete daily activities?: Yes Is the patient deaf or have difficulty hearing?: No Does the patient have difficulty seeing, even when wearing glasses/contacts?: No Does the patient have difficulty concentrating, remembering, or making decisions?: No Patient able to express need for assistance  with ADLs?: Yes Does the patient have difficulty dressing or bathing?: No Independently performs ADLs?: Yes (appropriate for developmental age) Does the patient have difficulty walking or climbing stairs?: No Weakness of Legs: None Weakness of Arms/Hands: Left  Permission Sought/Granted                  Emotional Assessment Appearance:: Appears stated age Attitude/Demeanor/Rapport: Engaged Affect (typically observed): Accepting Orientation: : Oriented to Self, Oriented to Place, Oriented to  Time, Oriented to Situation Alcohol / Substance Use: Not Applicable Psych Involvement: No (comment)  Admission diagnosis:  Cervical radiculopathy [M54.12] Patient Active Problem List   Diagnosis Date Noted   Cervical radiculopathy 07/13/2021   DKA (diabetic ketoacidosis) ([place]) 09/22/2016   Hyponatremia 09/22/2016   Acute renal insufficiency 09/22/2016   Dehydration 09/22/2016   Polycythemia 09/22/2016   DKA (diabetic ketoacidoses) 09/22/2016   Type 2 diabetes mellitus without complication, without long-term current use of insulin ([place]) 05/20/2016   Adaptation reaction 12/14/2014   Anxiety disorder 12/14/2014   Airway hyperreactivity 12/14/2014   Body mass index of 60 or higher 12/14/2014   History of chicken pox 12/14/2014   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 [place] [address] Phone: [phone] Fax: [phone]     Social Determinants of  Health (SDOH) Interventions    Readmission Risk Interventions No flowsheet data found.

## 2021-09-15 ENCOUNTER — Encounter: Payer: Self-pay | Admitting: Neurosurgery

## 2022-01-25 ENCOUNTER — Encounter: Payer: Self-pay | Admitting: *Deleted

## 2022-01-26 ENCOUNTER — Encounter: Payer: Self-pay | Admitting: *Deleted

## 2022-01-26 ENCOUNTER — Ambulatory Visit
Admission: RE | Admit: 2022-01-26 | Discharge: 2022-01-26 | Disposition: A | Discharge status: Home / Self Care | Payer: BC Managed Care – PPO | Attending: Gastroenterology | Admitting: Gastroenterology

## 2022-01-26 ENCOUNTER — Ambulatory Visit: Payer: BC Managed Care – PPO | Admitting: Anesthesiology

## 2022-01-26 ENCOUNTER — Encounter
Admission: RE | Disposition: A | Discharge status: Home / Self Care | Payer: Self-pay | Source: Home / Self Care | Attending: Gastroenterology

## 2022-01-26 DIAGNOSIS — E119 Type 2 diabetes mellitus without complications: Secondary | ICD-10-CM | POA: Diagnosis not present

## 2022-01-26 DIAGNOSIS — Z7984 Long term (current) use of oral hypoglycemic drugs: Secondary | ICD-10-CM | POA: Insufficient documentation

## 2022-01-26 DIAGNOSIS — F419 Anxiety disorder, unspecified: Secondary | ICD-10-CM | POA: Diagnosis not present

## 2022-01-26 DIAGNOSIS — Z6841 Body Mass Index (BMI) 40.0 and over, adult: Secondary | ICD-10-CM | POA: Insufficient documentation

## 2022-01-26 DIAGNOSIS — I10 Essential (primary) hypertension: Secondary | ICD-10-CM | POA: Insufficient documentation

## 2022-01-26 DIAGNOSIS — Z79899 Other long term (current) drug therapy: Secondary | ICD-10-CM | POA: Diagnosis not present

## 2022-01-26 DIAGNOSIS — J45909 Unspecified asthma, uncomplicated: Secondary | ICD-10-CM | POA: Insufficient documentation

## 2022-01-26 DIAGNOSIS — K219 Gastro-esophageal reflux disease without esophagitis: Secondary | ICD-10-CM | POA: Insufficient documentation

## 2022-01-26 DIAGNOSIS — K640 First degree hemorrhoids: Secondary | ICD-10-CM | POA: Insufficient documentation

## 2022-01-26 DIAGNOSIS — K317 Polyp of stomach and duodenum: Secondary | ICD-10-CM | POA: Insufficient documentation

## 2022-01-26 DIAGNOSIS — Z1211 Encounter for screening for malignant neoplasm of colon: Secondary | ICD-10-CM | POA: Insufficient documentation

## 2022-01-26 HISTORY — PX: ESOPHAGOGASTRODUODENOSCOPY (EGD) WITH PROPOFOL: SHX5813

## 2022-01-26 HISTORY — PX: COLONOSCOPY WITH PROPOFOL: SHX5780

## 2022-01-26 LAB — GLUCOSE, CAPILLARY: Glucose-Capillary: 108 mg/dL — ABNORMAL HIGH (ref 70–99)

## 2022-01-26 SURGERY — COLONOSCOPY WITH PROPOFOL
Anesthesia: General

## 2022-01-26 MED ORDER — LIDOCAINE HCL (CARDIAC) PF 100 MG/5ML IV SOSY
PREFILLED_SYRINGE | INTRAVENOUS | Status: DC | PRN
  Administered 2022-01-26: 100 mg via INTRAVENOUS

## 2022-01-26 MED ORDER — PROPOFOL 10 MG/ML IV BOLUS
INTRAVENOUS | Status: AC
  Filled 2022-01-26: qty 120

## 2022-01-26 MED ORDER — SODIUM CHLORIDE 0.9 % IV SOLN
INTRAVENOUS | Status: DC
Start: 2022-01-26 — End: 2022-01-26

## 2022-01-26 MED ORDER — PROPOFOL 10 MG/ML IV BOLUS
INTRAVENOUS | Status: DC | PRN
  Administered 2022-01-26 (×2): 30 mg via INTRAVENOUS
  Administered 2022-01-26: 100 mg via INTRAVENOUS
  Administered 2022-01-26: 40 mg via INTRAVENOUS
  Administered 2022-01-26: 50 mg via INTRAVENOUS
  Administered 2022-01-26: 30 mg via INTRAVENOUS

## 2022-01-26 MED ORDER — PROPOFOL 500 MG/50ML IV EMUL
INTRAVENOUS | Status: DC | PRN
  Administered 2022-01-26: 50 ug/kg/min via INTRAVENOUS

## 2022-01-26 MED ORDER — LIDOCAINE HCL (PF) 2 % IJ SOLN
INTRAMUSCULAR | Status: AC
  Filled 2022-01-26: qty 10

## 2022-01-26 NOTE — Anesthesia Preprocedure Evaluation (Addendum)
Anesthesia Evaluation  ?Patient identified by MRN, date of birth, ID band ?Patient awake ? ? ? ?Reviewed: ?Allergy & Precautions, NPO status , Patient's Chart, lab work & pertinent test results ? ?History of Anesthesia Complications ?Negative for: history of anesthetic complications ? ?Airway ?Mallampati: III ? ? ?Neck ROM: Full ? ? ? Dental ?no notable dental hx. ? ?  ?Pulmonary ?asthma ,  ?  ?Pulmonary exam normal ?breath sounds clear to auscultation ? ? ? ? ? ? Cardiovascular ?Exercise Tolerance: Good ?hypertension, Pt. on medications ?Normal cardiovascular exam ?Rhythm:Regular Rate:Normal ? ?ECG 07/02/21:  ?Normal sinus rhythm ?Minimal voltage criteria for LVH, may be normal variant ( Cornell product ) ?Prolonged QT ? ?Echo 02/08/20:  ?NORMAL LEFT VENTRICULAR SYSTOLIC FUNCTION  ?NORMAL RIGHT VENTRICULAR SYSTOLIC FUNCTION  ?TRIVIAL REGURGITATION NOTED  ?NO VALVULAR STENOSIS  ? ?  ?Neuro/Psych ?PSYCHIATRIC DISORDERS Anxiety negative neurological ROS ?   ? GI/Hepatic ?GERD  ,  ?Endo/Other  ?diabetes, Type 2, Oral Hypoglycemic AgentsMorbid obesityClass 3 obesity ? Renal/GU ?negative Renal ROS  ? ?  ?Musculoskeletal ? ? Abdominal ?(+) + obese,   ?Peds ? Hematology ?negative hematology ROS ?(+)   ?Anesthesia Other Findings ? ? Reproductive/Obstetrics ? ?  ? ? ? ? ? ? ? ? ? ? ? ? ? ?  ?  ? ? ? ? ? ? ? ?Anesthesia Physical ? ?Anesthesia Plan ? ?ASA: 3 ? ?Anesthesia Plan: General  ? ?Post-op Pain Management: Minimal or no pain anticipated  ? ?Induction: Intravenous ? ?PONV Risk Score and Plan: 3 and TIVA and Propofol infusion ? ?Airway Management Planned: Natural Airway ? ?Additional Equipment:  ? ?Intra-op Plan:  ? ?Post-operative Plan:  ? ?Informed Consent: I have reviewed the patients History and Physical, chart, labs and discussed the procedure including the risks, benefits and alternatives for the proposed anesthesia with the patient or authorized representative who has indicated  his/her understanding and acceptance.  ? ? ? ?Dental advisory given ? ?Plan Discussed with: CRNA ? ?Anesthesia Plan Comments: (Patient consented for risks of anesthesia including but not limited to:  ?- adverse reactions to medications ?- damage to eyes, teeth, lips or other oral mucosa ?- nerve damage due to positioning  ?- sore throat or hoarseness ?- Damage to heart, brain, nerves, lungs, other parts of body or loss of life ? ?Patient voiced understanding.)  ? ? ? ? ? ?Anesthesia Quick Evaluation ? ?

## 2022-01-26 NOTE — Op Note (Signed)
[facility] ?Gastroenterology ?Patient Name: Tina Henderson ?Procedure Date: 01/26/2022 9:56 AM ?MRN: [id number] ?Account #: [id number] ?Date of Birth: 1970/11/30 ?Admit Type: Outpatient ?Age: 51 ?Room: [facility] ENDO ROOM 1 ?Gender: Female ?Note Status: Finalized ?Instrument Name: Colonoscope [id number] ?Procedure:             Colonoscopy ?Indications:           Screening for colorectal malignant neoplasm ?Providers:             Andrey Farmer MD, MD ?Medicines:             Monitored Anesthesia Care ?Complications:         No immediate complications. ?Procedure:             Pre-Anesthesia Assessment: ?                       - Prior to the procedure, a History and Physical was  ?                       performed, and patient medications and allergies were  ?                       reviewed. The patient is competent. The risks and  ?                       benefits of the procedure and the sedation options and  ?                       risks were discussed with the patient. All questions  ?                       were answered and informed consent was obtained.  ?                       Patient identification and proposed procedure were  ?                       verified by the physician, the nurse, the  ?                       anesthesiologist, the anesthetist and the technician  ?                       in the endoscopy suite. Mental Status Examination:  ?                       alert and oriented. Airway Examination: normal  ?                       oropharyngeal airway and neck mobility. Respiratory  ?                       Examination: clear to auscultation. CV Examination:  ?                       normal. Prophylactic Antibiotics: The patient does not  ?                       require prophylactic antibiotics. Prior  ?  Anticoagulants: The patient has taken no previous  ?                       anticoagulant or antiplatelet agents. ASA Grade  ?                       Assessment: II - A patient  with mild systemic disease.  ?                       After reviewing the risks and benefits, the patient  ?                       was deemed in satisfactory condition to undergo the  ?                       procedure. The anesthesia plan was to use monitored  ?                       anesthesia care (MAC). Immediately prior to  ?                       administration of medications, the patient was  ?                       re-assessed for adequacy to receive sedatives. The  ?                       heart rate, respiratory rate, oxygen saturations,  ?                       blood pressure, adequacy of pulmonary ventilation, and  ?                       response to care were monitored throughout the  ?                       procedure. The physical status of the patient was  ?                       re-assessed after the procedure. ?                       After obtaining informed consent, the colonoscope was  ?                       passed under direct vision. Throughout the procedure,  ?                       the patient's blood pressure, pulse, and oxygen  ?                       saturations were monitored continuously. The  ?                       Colonoscope was introduced through the anus and  ?                       advanced to the the terminal ileum. The colonoscopy  ?  was performed without difficulty. The patient  ?                       tolerated the procedure well. The quality of the bowel  ?                       preparation was excellent. ?Findings: ?     The perianal and digital rectal examinations were normal. ?     The terminal ileum appeared normal. ?     Internal hemorrhoids were found during retroflexion. The hemorrhoids  ?     were Grade I (internal hemorrhoids that do not prolapse). ?     The exam was otherwise without abnormality on direct and retroflexion  ?     views. ?Impression:            - The examined portion of the ileum was normal. ?                       - Internal  hemorrhoids. ?                       - The examination was otherwise normal on direct and  ?                       retroflexion views. ?                       - No specimens collected. ?Recommendation:        - Discharge patient to home. ?                       - Resume previous diet. ?                       - Continue present medications. ?                       - Repeat colonoscopy in 10 years for screening  ?                       purposes. ?                       - Return to referring physician as previously  ?                       scheduled. ?Procedure Code(s):     --- Professional --- ?                       T7001, Colorectal cancer screening; colonoscopy on  ?                       individual not meeting criteria for high risk ?Diagnosis Code(s):     --- Professional --- ?                       Z12.11, Encounter for screening for malignant neoplasm  ?                       of colon ?  K64.0, First degree hemorrhoids ?CPT copyright 2019 American Medical Association. All rights reserved. ?The codes documented in this report are preliminary and upon coder review may  ?be revised to meet current compliance requirements. ?Andrey Farmer MD, MD ?01/26/2022 10:35:15 AM ?Number of Addenda: 0 ?Note Initiated On: 01/26/2022 9:56 AM ?Scope Withdrawal Time: 0 hours 7 minutes 43 seconds  ?Total Procedure Duration: 0 hours 12 minutes 27 seconds  ?Estimated Blood Loss:  Estimated blood loss: none. ?     [facility] ?

## 2022-01-26 NOTE — Anesthesia Postprocedure Evaluation (Signed)
Anesthesia Post Note ? ?Patient: Tina Henderson ? ?Procedure(s) Performed: COLONOSCOPY WITH PROPOFOL ?ESOPHAGOGASTRODUODENOSCOPY (EGD) WITH PROPOFOL ? ?Patient location during evaluation: Endoscopy ?Anesthesia Type: General ?Level of consciousness: awake and alert ?Pain management: pain level controlled ?Vital Signs Assessment: post-procedure vital signs reviewed and stable ?Respiratory status: spontaneous breathing, nonlabored ventilation and respiratory function stable ?Cardiovascular status: blood pressure returned to baseline and stable ?Postop Assessment: no apparent nausea or vomiting ?Anesthetic complications: no ? ? ?No notable events documented. ? ? ?Last Vitals:  ?Vitals:  ? 01/26/22 1044 01/26/22 1054  ?BP: (!) 131/96 135/87  ?Pulse: 74 70  ?Resp: 16 19  ?Temp:    ?SpO2: 99% 100%  ?  ?Last Pain:  ?Vitals:  ? 01/26/22 1054  ?TempSrc:   ?PainSc: 0-No pain  ? ? ?  ?  ?  ?  ?  ?  ? ?[place] Ouch ? ? ? ? ?

## 2022-01-26 NOTE — Interval H&P Note (Signed)
History and Physical Interval Note: ? ?01/26/2022 ?9:57 AM ? ?Tina Henderson  has presented today for surgery, with the diagnosis of GERD, CCA SCREEN.  The various methods of treatment have been discussed with the patient and family. After consideration of risks, benefits and other options for treatment, the patient has consented to  Procedure(s) with comments: ?COLONOSCOPY WITH PROPOFOL (N/A) - DM ?ESOPHAGOGASTRODUODENOSCOPY (EGD) WITH PROPOFOL (N/A) as a surgical intervention.  The patient's history has been reviewed, patient examined, no change in status, stable for surgery.  I have reviewed the patient's chart and labs.  Questions were answered to the patient's satisfaction.   ? ? ?Hilton Cork Francess Mullen ? ?Ok to proceed with EGD/Colonoscopy ?

## 2022-01-26 NOTE — H&P (Signed)
Outpatient short stay form Pre-procedure ?01/26/2022  ?Lesly Rubenstein, MD ? ?Primary Physician: Marinda Elk, MD ? ?Reason for visit:  GERD/Colon cancer screening ? ?History of present illness:   ? ?51 y/o lady with GERD, hypertension, obesity, and HLD here for EGD for GERD and colonoscopy for screening. No blood thinners. No family history of GI malignancies. History of appendectomy, hysterectomy, and cervical spinal fusion. ? ? ? ?Current Facility-Administered Medications:  ?  0.9 %  sodium chloride infusion, , Intravenous, Continuous, Eufelia Veno, Hilton Cork, MD, Last Rate: 20 mL/hr at 01/26/22 0929, New Bag at 01/26/22 0929 ? ?Medications Prior to Admission  ?Medication Sig Dispense Refill Last Dose  ? ALPRAZolam (XANAX) 0.5 MG tablet Take 1 tablet (0.5 mg total) by mouth 3 (three) times daily as needed. 60 tablet 0 Past Month  ? citalopram (CELEXA) 20 MG tablet TAKE 1 AND 1/2 TABLETS DAILY BY MOUTH 45 tablet 0 01/25/2022  ? cyanocobalamin 1000 MCG tablet Take 1,000 mcg by mouth daily.   Past Month  ? loratadine (CLARITIN) 10 MG tablet Take 10 mg by mouth daily.   01/25/2022  ? losartan-hydrochlorothiazide (HYZAAR) 50-12.5 MG tablet Take 1 tablet by mouth daily.   01/26/2022  ? metFORMIN (GLUCOPHAGE) 1000 MG tablet Take 1,000 mg by mouth 2 (two) times daily.   01/26/2022  ? pantoprazole (PROTONIX) 40 MG tablet Take 1 tablet (40 mg total) by mouth daily. 30 tablet 1 01/25/2022  ? rosuvastatin (CRESTOR) 10 MG tablet Take 5 mg by mouth daily.   01/25/2022  ? cyclobenzaprine (FLEXERIL) 10 MG tablet Take 10 mg by mouth 2 (two) times daily as needed for muscle spasms. (Patient not taking: Reported on 01/26/2022)   Not Taking  ? fluticasone (FLONASE) 50 MCG/ACT nasal spray SPRAY 2 SPRAYS INTO EACH NOSTRIL EVERY DAY (Patient taking differently: Place 2 sprays into both nostrils at bedtime.) 16 g 0 01/23/2022  ? gabapentin (NEURONTIN) 300 MG capsule Take 900 mg by mouth 2 (two) times daily. (Patient not taking: Reported  on 01/26/2022)   Not Taking  ? senna (SENOKOT) 8.6 MG TABS tablet Take 1 tablet (8.6 mg total) by mouth daily as needed for mild constipation. (Patient not taking: Reported on 01/26/2022) 30 tablet 0 Not Taking  ? ? ? ?Allergies  ?Allergen Reactions  ? Bee Venom Swelling  ? ? ? ?Past Medical History:  ?Diagnosis Date  ? Anxiety   ? Asthma   ? Diabetes mellitus without complication ([place])   ? GERD (gastroesophageal reflux disease)   ? Hyperlipidemia   ? Hypertension   ? ? ?Review of systems:  Otherwise negative.  ? ? ?Physical Exam ? ?Gen: Alert, oriented. Appears stated age.  ?HEENT: PERRLA. ?Lungs: No respiratory distress ?CV: RRR ?Abd: soft, benign, no masses ?Ext: No edema ? ? ? ?Planned procedures: Proceed with EGD/colonoscopy. The patient understands the nature of the planned procedure, indications, risks, alternatives and potential complications including but not limited to bleeding, infection, perforation, damage to internal organs and possible oversedation/side effects from anesthesia. The patient agrees and gives consent to proceed.  ?Please refer to procedure notes for findings, recommendations and patient disposition/instructions.  ? ? ? ?Lesly Rubenstein, MD ?[facility] ? ? ? ?  ?

## 2022-01-26 NOTE — Transfer of Care (Signed)
Immediate Anesthesia Transfer of Care Note ? ?Patient: Tina Henderson ? ?Procedure(s) Performed: COLONOSCOPY WITH PROPOFOL ?ESOPHAGOGASTRODUODENOSCOPY (EGD) WITH PROPOFOL ? ?Patient Location: PACU and Endoscopy Unit ? ?Anesthesia Type:General ? ?Level of Consciousness: awake, drowsy and patient cooperative ? ?Airway & Oxygen Therapy: Patient Spontanous Breathing ? ?Post-op Assessment: Report given to RN, Post -op Vital signs reviewed and stable and Patient moving all extremities ? ?Post vital signs: Reviewed and stable ? ?Last Vitals:  ?Vitals Value Taken Time  ?BP 101/59 01/26/22 1034  ?Temp 35.8 ?C 01/26/22 1034  ?Pulse 71 01/26/22 1036  ?Resp 14 01/26/22 1036  ?SpO2 99 % 01/26/22 1036  ?Vitals shown include unvalidated device data. ? ?Last Pain:  ?Vitals:  ? 01/26/22 1034  ?TempSrc: Temporal  ?PainSc:   ?   ? ?  ? ?Complications: No notable events documented. ?

## 2022-01-26 NOTE — Op Note (Signed)
[facility] ?Gastroenterology ?Patient Name: Tina Henderson ?Procedure Date: 01/26/2022 9:57 AM ?MRN: [id number] ?Account #: [id number] ?Date of Birth: 09/05/1971 ?Admit Type: Outpatient ?Age: 51 ?Room: [facility] ENDO ROOM 1 ?Gender: Female ?Note Status: Finalized ?Instrument Name: Upper Endoscope [id number] ?Procedure:             Upper GI endoscopy ?Indications:           Gastro-esophageal reflux disease ?Providers:             Andrey Farmer MD, MD ?Medicines:             Monitored Anesthesia Care ?Complications:         No immediate complications. Estimated blood loss:  ?                       Minimal. ?Procedure:             Pre-Anesthesia Assessment: ?                       - Prior to the procedure, a History and Physical was  ?                       performed, and patient medications and allergies were  ?                       reviewed. The patient is competent. The risks and  ?                       benefits of the procedure and the sedation options and  ?                       risks were discussed with the patient. All questions  ?                       were answered and informed consent was obtained.  ?                       Patient identification and proposed procedure were  ?                       verified by the physician, the nurse, the  ?                       anesthesiologist, the anesthetist and the technician  ?                       in the endoscopy suite. Mental Status Examination:  ?                       alert and oriented. Airway Examination: normal  ?                       oropharyngeal airway and neck mobility. Respiratory  ?                       Examination: clear to auscultation. CV Examination:  ?                       normal. Prophylactic Antibiotics: The patient does not  ?  require prophylactic antibiotics. Prior  ?                       Anticoagulants: The patient has taken no previous  ?                       anticoagulant or antiplatelet agents. ASA  Grade  ?                       Assessment: II - A patient with mild systemic disease.  ?                       After reviewing the risks and benefits, the patient  ?                       was deemed in satisfactory condition to undergo the  ?                       procedure. The anesthesia plan was to use monitored  ?                       anesthesia care (MAC). Immediately prior to  ?                       administration of medications, the patient was  ?                       re-assessed for adequacy to receive sedatives. The  ?                       heart rate, respiratory rate, oxygen saturations,  ?                       blood pressure, adequacy of pulmonary ventilation, and  ?                       response to care were monitored throughout the  ?                       procedure. The physical status of the patient was  ?                       re-assessed after the procedure. ?                       After obtaining informed consent, the endoscope was  ?                       passed under direct vision. Throughout the procedure,  ?                       the patient's blood pressure, pulse, and oxygen  ?                       saturations were monitored continuously. The Endoscope  ?                       was introduced through the mouth, and advanced to the  ?  second part of duodenum. The upper GI endoscopy was  ?                       accomplished without difficulty. The patient tolerated  ?                       the procedure well. ?Findings: ?     The examined esophagus was normal. ?     Multiple 1 to 8 mm sessile fundic gland polyps with no bleeding and no  ?     stigmata of recent bleeding were found in the gastric fundus and in the  ?     gastric body. Biopsies were taken with a cold forceps for histology.  ?     Estimated blood loss was minimal. ?     The exam of the stomach was otherwise normal. ?     The examined duodenum was normal. Estimated blood loss was minimal. ?Impression:             - Normal esophagus. ?                       - Multiple fundic gland polyps. Biopsied. ?                       - Normal examined duodenum. ?Recommendation:        - Await pathology results. ?                       - Perform a colonoscopy today. ?Procedure Code(s):     --- Professional --- ?                       [phone], Esophagogastroduodenoscopy, flexible,  ?                       transoral; with biopsy, single or multiple ?Diagnosis Code(s):     --- Professional --- ?                       K31.7, Polyp of stomach and duodenum ?                       K21.9, Gastro-esophageal reflux disease without  ?                       esophagitis ?CPT copyright 2019 American Medical Association. All rights reserved. ?The codes documented in this report are preliminary and upon coder review may  ?be revised to meet current compliance requirements. ?Andrey Farmer MD, MD ?01/26/2022 10:32:42 AM ?Number of Addenda: 0 ?Note Initiated On: 01/26/2022 9:57 AM ?Estimated Blood Loss:  Estimated blood loss was minimal. ?     [facility] ?

## 2022-01-27 ENCOUNTER — Encounter: Payer: Self-pay | Admitting: Gastroenterology

## 2022-01-27 LAB — SURGICAL PATHOLOGY

## 2022-02-15 ENCOUNTER — Other Ambulatory Visit: Payer: Self-pay | Admitting: Physician Assistant

## 2022-02-15 DIAGNOSIS — Z1231 Encounter for screening mammogram for malignant neoplasm of breast: Secondary | ICD-10-CM

## 2022-09-21 ENCOUNTER — Ambulatory Visit
Admission: RE | Admit: 2022-09-21 | Discharge: 2022-09-21 | Disposition: A | Discharge status: Home / Self Care | Payer: BC Managed Care – PPO | Source: Ambulatory Visit | Attending: Physician Assistant | Admitting: Physician Assistant

## 2022-09-21 DIAGNOSIS — Z1231 Encounter for screening mammogram for malignant neoplasm of breast: Secondary | ICD-10-CM | POA: Diagnosis not present

## 2022-09-23 ENCOUNTER — Other Ambulatory Visit: Payer: Self-pay | Admitting: Physician Assistant

## 2022-09-23 DIAGNOSIS — N6489 Other specified disorders of breast: Secondary | ICD-10-CM

## 2022-09-23 DIAGNOSIS — R928 Other abnormal and inconclusive findings on diagnostic imaging of breast: Secondary | ICD-10-CM

## 2022-09-27 ENCOUNTER — Ambulatory Visit
Admission: RE | Admit: 2022-09-27 | Discharge: 2022-09-27 | Disposition: A | Discharge status: Home / Self Care | Payer: BC Managed Care – PPO | Source: Ambulatory Visit | Attending: Physician Assistant | Admitting: Physician Assistant

## 2022-09-27 DIAGNOSIS — N6489 Other specified disorders of breast: Secondary | ICD-10-CM

## 2022-09-27 DIAGNOSIS — R928 Other abnormal and inconclusive findings on diagnostic imaging of breast: Secondary | ICD-10-CM

## 2022-10-04 ENCOUNTER — Other Ambulatory Visit: Payer: Self-pay | Admitting: Physician Assistant

## 2022-10-04 DIAGNOSIS — R928 Other abnormal and inconclusive findings on diagnostic imaging of breast: Secondary | ICD-10-CM

## 2022-10-04 DIAGNOSIS — N63 Unspecified lump in unspecified breast: Secondary | ICD-10-CM

## 2022-10-06 ENCOUNTER — Ambulatory Visit
Admission: RE | Admit: 2022-10-06 | Discharge: 2022-10-06 | Disposition: A | Discharge status: Home / Self Care | Payer: BC Managed Care – PPO | Source: Ambulatory Visit | Attending: Physician Assistant | Admitting: Physician Assistant

## 2022-10-06 DIAGNOSIS — R928 Other abnormal and inconclusive findings on diagnostic imaging of breast: Secondary | ICD-10-CM | POA: Diagnosis not present

## 2022-10-06 DIAGNOSIS — N63 Unspecified lump in unspecified breast: Secondary | ICD-10-CM | POA: Diagnosis present

## 2022-10-06 HISTORY — PX: BREAST BIOPSY: SHX20

## 2022-10-06 MED ORDER — LIDOCAINE HCL (PF) 1 % IJ SOLN
3.0000 mL | Freq: Once | INTRAMUSCULAR | Status: AC
  Administered 2022-10-06: 3 mL

## 2022-10-06 MED ORDER — LIDOCAINE-EPINEPHRINE 1 %-1:100000 IJ SOLN
8.0000 mL | Freq: Once | INTRAMUSCULAR | Status: AC
  Administered 2022-10-06: 8 mL

## 2022-10-07 ENCOUNTER — Encounter: Payer: Self-pay | Admitting: *Deleted

## 2022-10-07 LAB — SURGICAL PATHOLOGY

## 2022-10-07 NOTE — Progress Notes (Signed)
Referral recieved from [facility] for benign breast mass.  Appointment scheduled for surgical consultation with Dr. Peyton Najjar on Feb. 1 at 2:45.   No further needs at this time.

## 2022-10-19 ENCOUNTER — Other Ambulatory Visit: Payer: Self-pay | Admitting: General Surgery

## 2022-10-19 DIAGNOSIS — D241 Benign neoplasm of right breast: Secondary | ICD-10-CM

## 2022-10-21 ENCOUNTER — Ambulatory Visit: Admission: RE | Admit: 2022-10-21 | Payer: BC Managed Care – PPO | Source: Ambulatory Visit

## 2022-10-21 ENCOUNTER — Other Ambulatory Visit: Payer: Self-pay | Admitting: General Surgery

## 2022-10-21 ENCOUNTER — Ambulatory Visit
Admission: RE | Admit: 2022-10-21 | Discharge: 2022-10-21 | Disposition: A | Discharge status: Home / Self Care | Payer: BC Managed Care – PPO | Source: Ambulatory Visit | Attending: General Surgery | Admitting: General Surgery

## 2022-10-21 DIAGNOSIS — D241 Benign neoplasm of right breast: Secondary | ICD-10-CM

## 2022-10-21 MED ORDER — LIDOCAINE HCL (PF) 1 % IJ SOLN
5.0000 mL | Freq: Once | INTRAMUSCULAR | Status: AC
  Administered 2022-10-21: 5 mL
  Filled 2022-10-21: qty 5

## 2022-10-26 ENCOUNTER — Other Ambulatory Visit: Payer: BC Managed Care – PPO

## 2022-11-05 ENCOUNTER — Ambulatory Visit: Payer: Self-pay | Admitting: General Surgery

## 2022-11-08 ENCOUNTER — Encounter
Admission: RE | Admit: 2022-11-08 | Discharge: 2022-11-08 | Disposition: A | Discharge status: Home / Self Care | Payer: BC Managed Care – PPO | Source: Ambulatory Visit | Attending: General Surgery | Admitting: General Surgery

## 2022-11-08 ENCOUNTER — Other Ambulatory Visit: Payer: Self-pay

## 2022-11-08 VITALS — Ht 63.0 in | Wt 245.0 lb

## 2022-11-08 DIAGNOSIS — E119 Type 2 diabetes mellitus without complications: Secondary | ICD-10-CM

## 2022-11-08 DIAGNOSIS — I10 Essential (primary) hypertension: Secondary | ICD-10-CM

## 2022-11-08 NOTE — H&P (Signed)
PATIENT PROFILE: Tina Henderson is a 52 y.o. female who presents to the Clinic for evaluation of right breast intraductal papilloma.  PCP: Tina Nightingale, PA  HISTORY OF PRESENT ILLNESS: Tina Henderson reports she had her usual screening mammogram on 09/21/2022. This shows a suspicious small mass in the right breast. Diagnostic mammogram and ultrasound on 09/27/2022 showed a 5 mm mass on the retroareolar area at 8 o'clock position. Core biopsy showed intraductal papilloma without atypia.  Patient denies any nipple discharge. Patient denies any breast pain. Patient denies any previous palpable masses.   Family history of breast cancer: Great grandmother Family history of other cancers: Father with lung and bone cancer Menarche: 58 Menopause: In her late 18s after hysterectomy Used OCP: Yes Used estrogen and progesterone therapy: No History of Radiation to the chest: No Number of pregnancies: 2 Previous breast biopsies: None  PROBLEM LIST: Problem List Date Reviewed: 12/30/2021   Noted  Type 2 diabetes mellitus without complication, without long-term current use of insulin (CMS-HCC) 05/20/2016  Acid reflux 12/14/2014  Overview  Overview:  Patient has tried Prilosec, Omeprazole, Zantac    Airway hyperreactivity 12/14/2014  Anxiety disorder 12/14/2014  Bilateral polycystic ovarian syndrome 12/14/2014  BMI 40.0-44.9, adult (CMS-HCC) 12/14/2014  Clinical depression 12/14/2014  Hyperlipidemia associated with type 2 diabetes mellitus , unspecified (CMS-HCC) 12/14/2014  Overview  Overview:  . Tried to increase Lovastatin to '40mg'$  at bedtime and pt. experience leg pain.    Restless leg 12/14/2014  Avitaminosis D, unspecified 01/30/2009  Allergic rhinitis 01/11/2008  Hypertension associated with diabetes 01/11/2008   GENERAL REVIEW OF SYSTEMS:   General ROS: negative for - chills, fatigue, fever, weight gain or weight loss Allergy and Immunology ROS: negative for - hives  Hematological and  Lymphatic ROS: negative for - bleeding problems or bruising, negative for palpable nodes Endocrine ROS: negative for - heat or cold intolerance, hair changes Respiratory ROS: negative for - cough, shortness of breath or wheezing Cardiovascular ROS: no chest pain or palpitations GI ROS: negative for nausea, vomiting, abdominal pain, diarrhea, constipation Musculoskeletal ROS: negative for - joint swelling or muscle pain Neurological ROS: negative for - confusion, syncope Dermatological ROS: negative for pruritus and rash Psychiatric: negative for anxiety, depression, difficulty sleeping and memory loss  MEDICATIONS: Current Outpatient Medications  Medication Sig Dispense Refill  ALPRAZolam (XANAX) 0.5 MG tablet Take 1 tablet (0.5 mg total) by mouth once daily as needed 30 tablet 0  BD INTEGRA SYRINGE 3 mL 25 gauge x 1" Syrg as directed  citalopram (CELEXA) 20 MG tablet TAKE 1.5 TABLETS (30 MG TOTAL) BY MOUTH ONCE DAILY FOR 90 DAYS 135 tablet 1  cyanocobalamin (VITAMIN B12) 1,000 mcg/mL injection INJECT 1 ML (1,000 MCG TOTAL) INTO THE MUSCLE MONTHLY 3 mL 1  cyclobenzaprine (FLEXERIL) 5 MG tablet Take 1 tablet (5 mg total) by mouth 3 (three) times daily as needed for Muscle spasms 30 tablet 0  FLOWFLEX COVID-19 AG HOME TEST Kit FOLLOW INSTRUCTIONS INCLUDED WITH THE PACKAGE.  fluticasone propionate (FLONASE) 50 mcg/actuation nasal spray SPRAY 2 SPRAYS INTO EACH NOSTRIL EVERY DAY 70 g 2  loratadine (CLARITIN) 10 mg tablet Take 10 mg by mouth once daily.   losartan-hydroCHLOROthiazide (HYZAAR) 50-12.5 mg tablet Take 1 tablet by mouth once daily 90 tablet 1  metFORMIN (GLUCOPHAGE) 1000 MG tablet Take 1 tablet (1,000 mg total) by mouth 2 (two) times daily with meals 180 tablet 2  ONETOUCH ULTRA BLUE TEST STRIP test strip USE 3 (THREE) TIMES DAILY. ONE TOUCH  ULTRA-DX: E11.65 100 each 9  pantoprazole (PROTONIX) 40 MG DR tablet Take 1 tablet (40 mg total) by mouth once daily 90 tablet 1  rosuvastatin  (CRESTOR) 10 MG tablet TAKE 1 TABLET BY MOUTH EVERY DAY 90 tablet 1  semaglutide (OZEMPIC) 0.25 mg or 0.5 mg (2 mg/3 mL) pen injector Inject 0.375 mLs (0.25 mg total) subcutaneously once a week 3 mL 5  syringe with needle 1 mL 25 gauge x 1" syringe Use as directed 10 each 0  sodium, potassium, and magnesium (SUPREP) oral solution Take 1 Bottle by mouth as directed One kit contains 2 bottles. Take both bottles at the times instructed by your provider. 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 Tried to increase Lovastatin to '40mg'$  at bedtime and pt. experience leg pain.  Restless leg 12/14/2014  Type 2 diabetes mellitus without complication, without long-term current use of insulin (CMS-HCC) 05/20/2016   PAST SURGICAL HISTORY: Past Surgical History:  Procedure Laterality Date  APPENDECTOMY 2002  HYSTERECTOMY 2010  C5-7 anterior cervical discectomy and fusion 07/13/2021  Dr Tina Henderson at [facility], Tina Henderson  Colon @ [facility] 01/26/2022  Normal examined colon/Repeat 19yr/CTL  [facility]16/[facility]23  Fundic gland polyps/Repeat PRN/CTL    FAMILY HISTORY: Family History  Problem Relation Age of Onset  COPD  Mother  Lung cancer Father  COPD Father  Bone cancer Father  Myocardial Infarction (Heart attack) Maternal Grandmother  COPD Maternal Grandmother  Heart disease Maternal Grandfather  No Known Problems Paternal Grandmother  No Known Problems Paternal Grandfather  No Known Problems Son  No Known Problems Daughter  Diabetes type II Neg Hx    SOCIAL HISTORY: Social History   Socioeconomic History  Marital status: Married  Number of children: 2  Occupational History  Occupation: Unemployed  Tobacco Use  Smoking status: Never  Smokeless tobacco: Never  Vaping Use  Vaping Use: Never used  Substance and Sexual Activity  Alcohol use: No  Drug use: Never  Sexual activity: Defer   PHYSICAL EXAM: Vitals:  10/14/22 1442  BP: (!) 146/78  Pulse: 87   Body mass index is 44.99 kg/m. Weight: (!) 111.6 kg (246 lb 0.5 oz)   GENERAL: Alert, active, oriented x3  HEENT: Pupils equal reactive to light. Extraocular movements are intact. Sclera clear. Palpebral conjunctiva normal red color.Pharynx clear.  NECK: Supple with no palpable mass and no adenopathy.  LUNGS: Sound clear with no rales rhonchi or wheezes.  HEART: Regular rhythm S1 and S2 without murmur.  BREAST: breasts appear normal, no suspicious masses, no skin or nipple changes or axillary nodes.  ABDOMEN: Soft and depressible, nontender with no palpable mass, no hepatomegaly.  EXTREMITIES: Well-developed well-nourished symmetrical with no dependent edema.  NEUROLOGICAL: Awake alert oriented, facial expression symmetrical, moving all extremities.  REVIEW OF DATA: I have  reviewed the following data today: No visits with results within 3 Month(s) from this visit.  Latest known visit with results is:  Appointment on 09/25/2021  Component Date Value  WBC (White Blood Cell Co* 09/25/2021 8.1  RBC (Red Blood Cell Coun* 09/25/2021 4.18  Hemoglobin 09/25/2021 11.9 (L)  Hematocrit 09/25/2021 35.9  MCV (Mean Corpuscular  Vo* 09/25/2021 85.9  MCH (Mean Corpuscular He* 09/25/2021 28.5  MCHC (Mean Corpuscular H* 09/25/2021 33.1  Platelet Count 09/25/2021 254  RDW-CV (Red Cell Distrib* 09/25/2021 13.0  MPV (Mean Platelet Volum* 09/25/2021 10.7  Neutrophils 09/25/2021 4.67  Lymphocytes 09/25/2021 2.52  Monocytes 09/25/2021 0.60  Eosinophils 09/25/2021 0.21  Basophils 09/25/2021 0.04  Neutrophil % 09/25/2021 57.8  Lymphocyte % 09/25/2021 31.2  Monocyte % 09/25/2021 7.4  Eosinophil % 09/25/2021 2.6  Basophil% 09/25/2021 0.5  Immature Granulocyte % 09/25/2021 0.5  Immature Granulocyte Cou* 09/25/2021 0.04  Glucose 09/25/2021 121 (H)  Sodium 09/25/2021 139  Potassium 09/25/2021 4.0  Chloride 09/25/2021 104  Carbon Dioxide (CO2) 09/25/2021 28.4  Urea Nitrogen (BUN) 09/25/2021 13  Creatinine 09/25/2021 0.5 (L)  Glomerular Filtration Ra* 09/25/2021 131  Calcium 09/25/2021 8.9  AST 09/25/2021 13  ALT 09/25/2021 15  Alk Phos (alkaline Phosp* 09/25/2021 71  Albumin 09/25/2021 3.9  Bilirubin, Total 09/25/2021 0.2 (L)  Protein, Total 09/25/2021 6.0 (L)  A/G Ratio 09/25/2021 1.9  Cholesterol, Total 09/25/2021 147  Triglyceride 09/25/2021 63  HDL (High Density Lipopr* 09/25/2021 48.2  LDL Calculated 09/25/2021 86  VLDL Cholesterol 09/25/2021 13  Cholesterol/HDL Ratio 09/25/2021 3.0  Color 09/25/2021 Yellow  Clarity 09/25/2021 Clear  Specific Gravity 09/25/2021 1.020  pH, Urine 09/25/2021 6.5  Protein, Urinalysis 09/25/2021 Negative  Glucose, Urinalysis 09/25/2021 Negative  Ketones, Urinalysis 09/25/2021 Trace (!)  Blood, Urinalysis 09/25/2021 Negative  Nitrite, Urinalysis 09/25/2021 Negative  Leukocyte Esterase, Urin* 09/25/2021 Negative  White Blood Cells, Urina* 09/25/2021 4-10 (!)  Red Blood Cells, Urinaly* 09/25/2021 0-3  Bacteria, Urinalysis 09/25/2021 Many (!)  Squamous Epithelial Cell* 09/25/2021 Many (!)  Hemoglobin A1C 09/25/2021 6.4 (H)  Average Blood Glucose (C* 09/25/2021 137  Vitamin  B12 09/25/2021 185 (L)    ASSESSMENT: Ms. Cisneroz is a 52 y.o. female presenting for consultation for right breast intraductal papilloma.   Patient was oriented again about the pathology results. She was oriented about the implication of having an intraductal papilloma with atypia. Alternative of a palpation with close follow-up mammogram in 6 months versus excisional biopsy was discussed with patient. Reasoning for excisional biopsy is to rule out malignancy. Risk of upgrading to atypia or malignancy nowadays is considered less than 5%. Surgical alternatives were discussed with patient including excisional biopsy. Surgical technique and post operative care was discussed with patient. Risk of surgery was discussed with patient including but not limited to: wound infection, seroma, hematoma, brachial plexopathy, mondor's disease (thrombosis of small veins of breast), chronic wound pain, breast lymphedema, altered sensation to the nipple and cosmesis among others.   At this point with all information given, patient would like to discuss this recommendation with family and she will contact us with her final decision.  Patient decided to proceed with excisional biopsy of the intraductal papilloma.   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 RF tagged excisional biopsy of the right breast.   Patient verbalized understanding, all questions were answered, and were agreeable with the plan outlined above.   Herbert Pun, MD

## 2022-11-08 NOTE — Patient Instructions (Addendum)
Your procedure is scheduled on: Friday November 12, 2022. Report to the Registration Desk on the 1st floor of the [facility]. To find out your arrival time, please call [phone] between 1PM - 3PM on: Thursday November 11, 2022. If your arrival time is 6:00 am, do not arrive before that time as the [facility] entrance doors do not open until 6:00 am.  REMEMBER: Instructions that are not followed completely may result in serious medical risk, up to and including death; or upon the discretion of your surgeon and anesthesiologist your surgery may need to be rescheduled.  Do not eat food or drink fluids after midnight the night before surgery.  No gum chewing or hard candies.  One week prior to surgery: Stop Anti-inflammatories (NSAIDS) such as Advil, Aleve, Ibuprofen, Motrin, Naproxen, Naprosyn and Aspirin based products such as Excedrin, Goody's Powder, BC Powder. Stop ALL OVER THE COUNTER supplements and vitamins until after surgery.  You may however, continue to take Tylenol if needed for pain up until the day of surgery.  Continue taking all prescribed medications with the exception of the following: Stop metFORMIN (GLUCOPHAGE) 1000 MG 2 days prior to surgery (take last dose Tuesday 11/09/22)   TAKE ONLY THESE MEDICATIONS THE MORNING OF SURGERY WITH A SIP OF WATER:   pantoprazole (PROTONIX) 40 MG Antacid (take one the night before and one on the morning of surgery - helps to prevent nausea after surgery.) ALPRAZolam (XANAX) 0.5 MG   Use inhalers on the day of surgery and bring to the hospital.   No Alcohol for 24 hours before or after surgery.  No Smoking including e-cigarettes for 24 hours before surgery.  No chewable tobacco products for at least 6 hours before surgery.  No nicotine patches on the day of surgery.  Do not use any "recreational" drugs for at least a week (preferably 2 weeks) before your surgery.  Please be advised that the combination of cocaine and  anesthesia may have negative outcomes, up to and including death. If you test positive for cocaine, your surgery will be cancelled.  On the morning of surgery brush your teeth with toothpaste and water, you may rinse your mouth with mouthwash if you wish. Do not swallow any toothpaste or mouthwash.  Use CHG Soap or wipes as directed on instruction sheet.  Do not wear jewelry, make-up, hairpins, clips or nail polish.  Do not wear lotions, powders, or perfumes.   Do not shave body hair from the neck down 48 hours before surgery.  Contact lenses, hearing aids and dentures may not be worn into surgery.  Do not bring valuables to the hospital. [facility] is not responsible for any missing/lost belongings or valuables.   Notify your doctor if there is any change in your medical condition (cold, fever, infection).  Wear comfortable clothing (specific to your surgery type) to the hospital.  After surgery, you can help prevent lung complications by doing breathing exercises.  Take deep breaths and cough every 1-2 hours. Your doctor may order a device called an Incentive Spirometer to help you take deep breaths. When coughing or sneezing, hold a pillow firmly against your incision with both hands. This is called "splinting." Doing this helps protect your incision. It also decreases belly discomfort.  If you are being admitted to the hospital overnight, leave your suitcase in the car. After surgery it may be brought to your room.  In case of increased patient census, it may be necessary for you, the patient,  to continue your postoperative care in the Same Day Surgery department.  If you are being discharged the day of surgery, you will not be allowed to drive home. You will need a responsible individual to drive you home and stay with you for 24 hours after surgery.   If you are taking public transportation, you will need to have a responsible individual with you.  Please call the  [place] Dept. at [phone] if you have any questions about these instructions.  Surgery Visitation Policy:  Patients undergoing a surgery or procedure may have two family members or support persons with them as long as the person is not COVID-19 positive or experiencing its symptoms.   Inpatient Visitation:    Visiting hours are 7 a.m. to 8 p.m. Up to four visitors are allowed at one time in a patient room. The visitors may rotate out with other people during the day. One designated support person (adult) may remain overnight.  Due to an increase in RSV and influenza rates and associated hospitalizations, children ages 7 and under will not be able to visit patients in [facility]. Masks continue to be strongly recommended.    Preparing for Surgery with CHLORHEXIDINE GLUCONATE (CHG) Soap  Chlorhexidine Gluconate (CHG) Soap  o An antiseptic cleaner that kills germs and bonds with the skin to continue killing germs even after washing  o Used for showering the night before surgery and morning of surgery  Before surgery, you can play an important role by reducing the number of germs on your skin.  CHG (Chlorhexidine gluconate) soap is an antiseptic cleanser which kills germs and bonds with the skin to continue killing germs even after washing.  Please do not use if you have an allergy to CHG or antibacterial soaps. If your skin becomes reddened/irritated stop using the CHG.  1. Shower the NIGHT BEFORE SURGERY and the MORNING OF SURGERY with CHG soap.  2. If you choose to wash your hair, wash your hair first as usual with your normal shampoo.  3. After shampooing, rinse your hair and body thoroughly to remove the shampoo.  4. Use CHG as you would any other liquid soap. You can apply CHG directly to the skin and wash gently with a scrungie or a clean washcloth.  5. Apply the CHG soap to your body only from the neck down. Do not use on open wounds or open  sores. Avoid contact with your eyes, ears, mouth, and genitals (private parts). Wash face and genitals (private parts) with your normal soap.  6. Wash thoroughly, paying special attention to the area where your surgery will be performed.  7. Thoroughly rinse your body with warm water.  8. Do not shower/wash with your normal soap after using and rinsing off the CHG soap.  9. Pat yourself dry with a clean towel.  10. Wear clean pajamas to bed the night before surgery.  12. Place clean sheets on your bed the night of your first shower and do not sleep with pets.  13. Shower again with the CHG soap on the day of surgery prior to arriving at the hospital.  14. Do not apply any deodorants/lotions/powders.  15. Please wear clean clothes to the hospital.

## 2022-11-10 ENCOUNTER — Encounter
Admission: RE | Admit: 2022-11-10 | Discharge: 2022-11-10 | Disposition: A | Discharge status: Home / Self Care | Payer: BC Managed Care – PPO | Source: Ambulatory Visit | Attending: General Surgery | Admitting: General Surgery

## 2022-11-10 DIAGNOSIS — I10 Essential (primary) hypertension: Secondary | ICD-10-CM | POA: Insufficient documentation

## 2022-11-10 DIAGNOSIS — Z90710 Acquired absence of both cervix and uterus: Secondary | ICD-10-CM | POA: Diagnosis not present

## 2022-11-10 DIAGNOSIS — E282 Polycystic ovarian syndrome: Secondary | ICD-10-CM | POA: Diagnosis not present

## 2022-11-10 DIAGNOSIS — Z7984 Long term (current) use of oral hypoglycemic drugs: Secondary | ICD-10-CM | POA: Diagnosis not present

## 2022-11-10 DIAGNOSIS — Z803 Family history of malignant neoplasm of breast: Secondary | ICD-10-CM | POA: Diagnosis not present

## 2022-11-10 DIAGNOSIS — N6341 Unspecified lump in right breast, subareolar: Secondary | ICD-10-CM | POA: Diagnosis not present

## 2022-11-10 DIAGNOSIS — N6041 Mammary duct ectasia of right breast: Secondary | ICD-10-CM | POA: Diagnosis not present

## 2022-11-10 DIAGNOSIS — D241 Benign neoplasm of right breast: Secondary | ICD-10-CM | POA: Diagnosis present

## 2022-11-10 DIAGNOSIS — Z01810 Encounter for preprocedural cardiovascular examination: Secondary | ICD-10-CM | POA: Insufficient documentation

## 2022-11-11 MED ORDER — CEFAZOLIN SODIUM-DEXTROSE 2-4 GM/100ML-% IV SOLN
2.0000 g | INTRAVENOUS | Status: AC
  Administered 2022-11-12: 2 g via INTRAVENOUS

## 2022-11-11 MED ORDER — CHLORHEXIDINE GLUCONATE 0.12 % MT SOLN: 15.0000 mL | Freq: Once | OROMUCOSAL | Status: AC

## 2022-11-11 MED ORDER — SODIUM CHLORIDE 0.9 % IV SOLN: INTRAVENOUS | Status: DC

## 2022-11-11 MED ORDER — ORAL CARE MOUTH RINSE: 15.0000 mL | Freq: Once | OROMUCOSAL | Status: AC

## 2022-11-12 ENCOUNTER — Ambulatory Visit
Admission: RE | Admit: 2022-11-12 | Discharge: 2022-11-12 | Disposition: A | Discharge status: Home / Self Care | Payer: BC Managed Care – PPO | Source: Ambulatory Visit | Attending: General Surgery | Admitting: General Surgery

## 2022-11-12 ENCOUNTER — Other Ambulatory Visit: Payer: Self-pay

## 2022-11-12 ENCOUNTER — Ambulatory Visit: Payer: BC Managed Care – PPO | Admitting: Certified Registered"

## 2022-11-12 ENCOUNTER — Encounter: Payer: Self-pay | Admitting: General Surgery

## 2022-11-12 ENCOUNTER — Encounter
Admission: RE | Disposition: A | Discharge status: Home / Self Care | Payer: Self-pay | Source: Home / Self Care | Attending: General Surgery

## 2022-11-12 ENCOUNTER — Ambulatory Visit
Admission: RE | Admit: 2022-11-12 | Discharge: 2022-11-12 | Disposition: A | Discharge status: Home / Self Care | Payer: BC Managed Care – PPO | Attending: General Surgery | Admitting: General Surgery

## 2022-11-12 DIAGNOSIS — D241 Benign neoplasm of right breast: Secondary | ICD-10-CM

## 2022-11-12 DIAGNOSIS — N6341 Unspecified lump in right breast, subareolar: Secondary | ICD-10-CM | POA: Insufficient documentation

## 2022-11-12 DIAGNOSIS — Z90710 Acquired absence of both cervix and uterus: Secondary | ICD-10-CM | POA: Insufficient documentation

## 2022-11-12 DIAGNOSIS — E282 Polycystic ovarian syndrome: Secondary | ICD-10-CM | POA: Insufficient documentation

## 2022-11-12 DIAGNOSIS — Z7984 Long term (current) use of oral hypoglycemic drugs: Secondary | ICD-10-CM | POA: Insufficient documentation

## 2022-11-12 DIAGNOSIS — Z803 Family history of malignant neoplasm of breast: Secondary | ICD-10-CM | POA: Insufficient documentation

## 2022-11-12 DIAGNOSIS — E119 Type 2 diabetes mellitus without complications: Secondary | ICD-10-CM

## 2022-11-12 DIAGNOSIS — N6041 Mammary duct ectasia of right breast: Secondary | ICD-10-CM | POA: Insufficient documentation

## 2022-11-12 HISTORY — PX: BREAST BIOPSY WITH RADIO FREQUENCY LOCALIZER: SHX6895

## 2022-11-12 LAB — GLUCOSE, CAPILLARY
Glucose-Capillary: 127 mg/dL — ABNORMAL HIGH (ref 70–99)
Glucose-Capillary: 119 mg/dL — ABNORMAL HIGH (ref 70–99)

## 2022-11-12 SURGERY — BREAST BIOPSY WITH RADIO FREQUENCY LOCALIZER
Anesthesia: General | Site: Breast | Laterality: Right

## 2022-11-12 MED ORDER — HYDROCODONE-ACETAMINOPHEN 5-325 MG PO TABS: 1.0000 | ORAL_TABLET | ORAL | 0 refills | Status: AC | PRN

## 2022-11-12 MED ORDER — EPHEDRINE SULFATE (PRESSORS) 50 MG/ML IJ SOLN
INTRAMUSCULAR | Status: DC | PRN
  Administered 2022-11-12: 10 mg via INTRAVENOUS

## 2022-11-12 MED ORDER — LIDOCAINE HCL (PF) 2 % IJ SOLN
INTRAMUSCULAR | Status: AC
  Filled 2022-11-12: qty 20

## 2022-11-12 MED ORDER — FENTANYL CITRATE (PF) 100 MCG/2ML IJ SOLN: 25.0000 ug | INTRAMUSCULAR | Status: DC | PRN

## 2022-11-12 MED ORDER — ONDANSETRON HCL 4 MG/2ML IJ SOLN
INTRAMUSCULAR | Status: AC
  Filled 2022-11-12: qty 8

## 2022-11-12 MED ORDER — FENTANYL CITRATE (PF) 100 MCG/2ML IJ SOLN
INTRAMUSCULAR | Status: AC
  Filled 2022-11-12: qty 2

## 2022-11-12 MED ORDER — BUPIVACAINE HCL (PF) 0.5 % IJ SOLN
INTRAMUSCULAR | Status: AC
  Filled 2022-11-12: qty 30

## 2022-11-12 MED ORDER — DEXAMETHASONE SODIUM PHOSPHATE 10 MG/ML IJ SOLN
INTRAMUSCULAR | Status: AC
  Filled 2022-11-12: qty 3

## 2022-11-12 MED ORDER — PROPOFOL 10 MG/ML IV BOLUS
INTRAVENOUS | Status: DC | PRN
  Administered 2022-11-12: 200 mg via INTRAVENOUS
  Administered 2022-11-12: 150 ug/kg/min via INTRAVENOUS

## 2022-11-12 MED ORDER — MIDAZOLAM HCL 2 MG/2ML IJ SOLN
INTRAMUSCULAR | Status: AC
  Filled 2022-11-12: qty 2

## 2022-11-12 MED ORDER — OXYCODONE HCL 5 MG/5ML PO SOLN: 5.0000 mg | Freq: Once | ORAL | Status: AC | PRN

## 2022-11-12 MED ORDER — CHLORHEXIDINE GLUCONATE 0.12 % MT SOLN
OROMUCOSAL | Status: AC
  Administered 2022-11-12: 15 mL via OROMUCOSAL
  Filled 2022-11-12: qty 15

## 2022-11-12 MED ORDER — GLYCOPYRROLATE 0.2 MG/ML IJ SOLN
INTRAMUSCULAR | Status: DC | PRN
  Administered 2022-11-12: .2 mg via INTRAVENOUS

## 2022-11-12 MED ORDER — ONDANSETRON HCL 4 MG/2ML IJ SOLN
INTRAMUSCULAR | Status: DC | PRN
  Administered 2022-11-12: 4 mg via INTRAVENOUS

## 2022-11-12 MED ORDER — PHENYLEPHRINE 80 MCG/ML (10ML) SYRINGE FOR IV PUSH (FOR BLOOD PRESSURE SUPPORT)
PREFILLED_SYRINGE | INTRAVENOUS | Status: AC
  Filled 2022-11-12: qty 30

## 2022-11-12 MED ORDER — FENTANYL CITRATE (PF) 100 MCG/2ML IJ SOLN
INTRAMUSCULAR | Status: DC | PRN
  Administered 2022-11-12 (×2): 25 ug via INTRAVENOUS
  Administered 2022-11-12: 50 ug via INTRAVENOUS

## 2022-11-12 MED ORDER — GLYCOPYRROLATE 0.2 MG/ML IJ SOLN
INTRAMUSCULAR | Status: AC
  Filled 2022-11-12: qty 1

## 2022-11-12 MED ORDER — OXYCODONE HCL 5 MG PO TABS
ORAL_TABLET | ORAL | Status: AC
  Filled 2022-11-12: qty 1

## 2022-11-12 MED ORDER — MIDAZOLAM HCL 2 MG/2ML IJ SOLN
INTRAMUSCULAR | Status: DC | PRN
  Administered 2022-11-12: 2 mg via INTRAVENOUS

## 2022-11-12 MED ORDER — ROCURONIUM BROMIDE 10 MG/ML (PF) SYRINGE
PREFILLED_SYRINGE | INTRAVENOUS | Status: AC
  Filled 2022-11-12: qty 20

## 2022-11-12 MED ORDER — LIDOCAINE HCL (CARDIAC) PF 100 MG/5ML IV SOSY
PREFILLED_SYRINGE | INTRAVENOUS | Status: DC | PRN
  Administered 2022-11-12: 100 mg via INTRAVENOUS

## 2022-11-12 MED ORDER — BUPIVACAINE HCL (PF) 0.5 % IJ SOLN
INTRAMUSCULAR | Status: DC | PRN
  Administered 2022-11-12: 20 mL

## 2022-11-12 MED ORDER — ACETAMINOPHEN 10 MG/ML IV SOLN
INTRAVENOUS | Status: DC | PRN
  Administered 2022-11-12: 1000 mg via INTRAVENOUS

## 2022-11-12 MED ORDER — OXYCODONE HCL 5 MG PO TABS
5.0000 mg | ORAL_TABLET | Freq: Once | ORAL | Status: AC | PRN
  Administered 2022-11-12: 5 mg via ORAL

## 2022-11-12 MED ORDER — EPHEDRINE 5 MG/ML INJ
INTRAVENOUS | Status: AC
  Filled 2022-11-12: qty 5

## 2022-11-12 MED ORDER — PHENYLEPHRINE HCL (PRESSORS) 10 MG/ML IV SOLN
INTRAVENOUS | Status: DC | PRN
  Administered 2022-11-12 (×4): 80 ug via INTRAVENOUS

## 2022-11-12 MED ORDER — CEFAZOLIN SODIUM-DEXTROSE 2-4 GM/100ML-% IV SOLN
INTRAVENOUS | Status: AC
  Filled 2022-11-12: qty 100

## 2022-11-12 SURGICAL SUPPLY — 42 items
ADH SKN CLS APL DERMABOND .7 (GAUZE/BANDAGES/DRESSINGS) ×1
APL PRP STRL LF DISP 70% ISPRP (MISCELLANEOUS) ×1
BLADE SURG 15 STRL LF DISP TIS (BLADE) ×1 IMPLANT
BLADE SURG 15 STRL SS (BLADE) ×1
CHLORAPREP W/TINT 26 (MISCELLANEOUS) ×1 IMPLANT
CNTNR URN SCR LID CUP LEK RST (MISCELLANEOUS) ×1 IMPLANT
CONT SPEC 4OZ STRL OR WHT (MISCELLANEOUS)
DERMABOND ADVANCED .7 DNX12 (GAUZE/BANDAGES/DRESSINGS) ×1 IMPLANT
DEVICE DUBIN SPECIMEN MAMMOGRA (MISCELLANEOUS) ×1 IMPLANT
DRAPE LAPAROTOMY TRNSV 106X77 (MISCELLANEOUS) ×1 IMPLANT
ELECT CAUTERY BLADE TIP 2.5 (TIP) ×1
ELECT REM PT RETURN 9FT ADLT (ELECTROSURGICAL) ×1
ELECTRODE CAUTERY BLDE TIP 2.5 (TIP) ×1 IMPLANT
ELECTRODE REM PT RTRN 9FT ADLT (ELECTROSURGICAL) ×1 IMPLANT
GAUZE 4X4 16PLY ~~LOC~~+RFID DBL (SPONGE) ×1 IMPLANT
GLOVE BIO SURGEON STRL SZ 6.5 (GLOVE) ×1 IMPLANT
GLOVE BIOGEL PI IND STRL 6.5 (GLOVE) ×1 IMPLANT
GOWN STRL REUS W/ TWL LRG LVL3 (GOWN DISPOSABLE) ×3 IMPLANT
GOWN STRL REUS W/TWL LRG LVL3 (GOWN DISPOSABLE) ×5
KIT MARKER MARGIN INK (KITS) IMPLANT
KIT TURNOVER KIT A (KITS) ×1 IMPLANT
LABEL OR SOLS (LABEL) ×1 IMPLANT
MANIFOLD NEPTUNE II (INSTRUMENTS) ×1 IMPLANT
MARKER MARGIN CORRECT CLIP (MARKER) IMPLANT
NDL HYPO 25X1 1.5 SAFETY (NEEDLE) ×1 IMPLANT
NEEDLE HYPO 25X1 1.5 SAFETY (NEEDLE) ×1 IMPLANT
PACK BASIN MINOR ARMC (MISCELLANEOUS) ×1 IMPLANT
RETRACTOR RING XSMALL (MISCELLANEOUS) IMPLANT
RTRCTR WOUND ALEXIS 13CM XS SH (MISCELLANEOUS) ×1
SET LOCALIZER 20 PROBE US (MISCELLANEOUS) ×1 IMPLANT
SUT MNCRL 4-0 (SUTURE) ×1
SUT MNCRL 4-0 27XMFL (SUTURE) ×1
SUT SILK 2 0 SH (SUTURE) ×1 IMPLANT
SUT VIC AB 3-0 SH 27 (SUTURE) ×1
SUT VIC AB 3-0 SH 27X BRD (SUTURE) ×1 IMPLANT
SUTURE MNCRL 4-0 27XMF (SUTURE) ×1 IMPLANT
SYR 10ML LL (SYRINGE) ×1 IMPLANT
SYR BULB IRRIG 60ML STRL (SYRINGE) ×1 IMPLANT
TRAP FLUID SMOKE EVACUATOR (MISCELLANEOUS) ×1 IMPLANT
TRAP NEPTUNE SPECIMEN COLLECT (MISCELLANEOUS) ×1 IMPLANT
WATER STERILE IRR 1000ML POUR (IV SOLUTION) ×1 IMPLANT
WATER STERILE IRR 500ML POUR (IV SOLUTION) ×1 IMPLANT

## 2022-11-12 NOTE — Op Note (Signed)
Preoperative diagnosis: Right breast intraductal papilloma.  Postoperative diagnosis: Same.   Procedure: Right radiofrequency tag-localized excisional biopsy.                      Anesthesia: GETA  Surgeon: Dr. Windell Moment  Wound Classification: Clean  Indications: Patient is a 52 y.o. female with a nonpalpable right breast mass noted on mammography with core biopsy demonstrating intraductal papilloma requires radiofrequency tag-localized excisional biopsy to rule out malignancy.   Findings: 1. Specimen mammography shows marker and tag on specimen 2. No other palpable mass or lymph node identified.   Description of procedure: Preoperative radiofrequency tag localization was performed by radiology. The patient was taken to the operating room and placed supine on the operating table, and after general anesthesia the right chest was prepped and draped in the usual sterile fashion. A time-out was completed verifying correct patient, procedure, site, positioning, and implant(s) and/or special equipment prior to beginning this procedure.  By comparing the localization studies and interrogation with Localizer device, the probable trajectory and location of the mass was visualized. A circumareolar skin incision was planned in such a way as to minimize the amount of dissection to reach the mass.  The skin incision was made. Flaps were raised and the location of the tag was confirmed with Localizer device confirmed. A 2-0 silk figure-of-eight stay suture was placed and used for retraction. Dissection was then taken down circumferentially, taking care to include the entire localizing tag and a wide margin of grossly normal tissue. The specimen and entire localizing tag were removed. The specimen was oriented and sent to radiology with the localization studies. Confirmation was received that the entire target lesion had been resected. The wound was irrigated. Hemostasis was checked. The wound was closed  with interrupted sutures of 3-0 Vicryl and a subcuticular suture of Monocryl 3-0. No attempt was made to close the dead space.   Specimen: Right breast excisional biopsy                   Complications: None  Estimated Blood Loss: 5 mL

## 2022-11-12 NOTE — Transfer of Care (Signed)
Immediate Anesthesia Transfer of Care Note  Patient: Tina Henderson  Procedure(s) Performed: BREAST BIOPSY WITH RADIO FREQUENCY LOCALIZER (Right: Breast)  Patient Location: PACU  Anesthesia Type:General  Level of Consciousness: awake, alert , and oriented  Airway & Oxygen Therapy: Patient Spontanous Breathing  Post-op Assessment: Report given to RN and Post -op Vital signs reviewed and stable  Post vital signs: Reviewed and stable  Last Vitals:  Vitals Value Taken Time  BP 125/84 11/12/22 1017  Temp    Pulse 72 11/12/22 1019  Resp 14 11/12/22 1019  SpO2 98 % 11/12/22 1019  Vitals shown include unvalidated device data.  Last Pain:  Vitals:   11/12/22 0831  TempSrc: Oral         Complications: No notable events documented.

## 2022-11-12 NOTE — Anesthesia Preprocedure Evaluation (Addendum)
Anesthesia Evaluation  Patient identified by MRN, date of birth, ID band Patient awake    Reviewed: Allergy & Precautions, NPO status , Patient's Chart, lab work & pertinent test results  History of Anesthesia Complications Negative for: history of anesthetic complications  Airway Mallampati: III  TM Distance: >3 FB Neck ROM: full    Dental no notable dental hx.    Pulmonary neg pulmonary ROS   Pulmonary exam normal        Cardiovascular hypertension, On Medications negative cardio ROS Normal cardiovascular exam     Neuro/Psych  PSYCHIATRIC DISORDERS Anxiety Depression     Neuromuscular disease    GI/Hepatic Neg liver ROS,GERD  Medicated,,  Endo/Other  diabetes  Morbid obesity  Renal/GU      Musculoskeletal   Abdominal   Peds  Hematology negative hematology ROS (+)   Anesthesia Other Findings Past Medical History: No date: Anxiety No date: Asthma No date: Diabetes mellitus without complication (HCC) No date: GERD (gastroesophageal reflux disease) No date: Hyperlipidemia No date: Hypertension  Past Surgical History: 01/04/2011: ABDOMINAL HYSTERECTOMY     Comment:  Due to Endometriosis 07/13/2021: ANTERIOR CERVICAL DECOMP/DISCECTOMY FUSION; N/A     Comment:  Procedure: C5-6, C6-7 ANTERIOR CERVICAL               DECOMPRESSION/DISCECTOMY FUSION 2 LEVELS;  Surgeon: [name], MD;  Location: [facility];  Service: Neurosurgery;               Laterality: N/A; 09/13/2000: APPENDECTOMY 10/06/2022: BREAST BIOPSY; Right     Comment:  [place] Core Bx Ribbon clip - path pending 10/06/2022: BREAST BIOPSY; Right     Comment:  [place] RT BREAST BX W LOC DEV 1ST LESION IMG BX SPEC [place]               GUIDE 10/06/2022 [facility] 01/26/2022: COLONOSCOPY WITH PROPOFOL; N/A     Comment:  Procedure: COLONOSCOPY WITH PROPOFOL;  Surgeon:               Lesly Rubenstein, MD;  Location: [facility];                 Service: Endoscopy;  Laterality: N/A;  DM 01/26/2022: ESOPHAGOGASTRODUODENOSCOPY (EGD) WITH PROPOFOL; N/A     Comment:  Procedure: ESOPHAGOGASTRODUODENOSCOPY (EGD) WITH               PROPOFOL;  Surgeon: Lesly Rubenstein, MD;  Location:               [facility];  Service: Endoscopy;  Laterality: N/A; No date: LAPAROSCOPIC OVARIAN  BMI    Body Mass Index: 43.40 kg/m      Reproductive/Obstetrics negative OB ROS                             Anesthesia Physical Anesthesia Plan  ASA: 3  Anesthesia Plan: General ETT   Post-op Pain Management: Ofirmev IV (intra-op)* and Toradol IV (intra-op)*   Induction: Intravenous  PONV Risk Score and Plan: 3 and Ondansetron, Dexamethasone, Midazolam and Treatment may vary due to age or medical condition  Airway Management Planned: Oral ETT  Additional Equipment:   Intra-op Plan:   Post-operative Plan: Extubation in OR  Informed Consent: I have reviewed the patients History and Physical, chart, labs and discussed the procedure including the risks, benefits and alternatives for the proposed anesthesia with  the patient or authorized representative who has indicated his/her understanding and acceptance.     Dental Advisory Given  Plan Discussed with: Anesthesiologist, CRNA and Surgeon  Anesthesia Plan Comments: (Patient consented for risks of anesthesia including but not limited to:  - adverse reactions to medications - damage to eyes, teeth, lips or other oral mucosa - nerve damage due to positioning  - sore throat or hoarseness - Damage to heart, brain, nerves, lungs, other parts of body or loss of life  Patient voiced understanding.)        Anesthesia Quick Evaluation

## 2022-11-12 NOTE — Discharge Instructions (Addendum)
  Diet: Resume home heart healthy regular diet.   Activity: No heavy lifting >20 pounds (children, pets, laundry, garbage) or strenuous activity until follow-up, but light activity and walking are encouraged. Do not drive or drink alcohol if taking narcotic pain medications.  Wound care: May shower with soapy water and pat dry (do not rub incisions), but no baths or submerging incision underwater until follow-up. (no swimming)   Medications: Resume all home medications. For mild to moderate pain: acetaminophen (Tylenol) or ibuprofen (if no kidney disease). Combining Tylenol with alcohol can substantially increase your risk of causing liver disease. Narcotic pain medications, if prescribed, can be used for severe pain, though may cause nausea, constipation, and drowsiness. Do not combine Tylenol and Norco within a 6 hour period as Norco contains Tylenol. If you do not need the narcotic pain medication, you do not need to fill the prescription.  Call office ([phone]) at any time if any questions, worsening pain, fevers/chills, bleeding, drainage from incision site, or other concerns.   AMBULATORY SURGERY  DISCHARGE INSTRUCTIONS   The drugs that you were given will stay in your system until tomorrow so for the next 24 hours you should not:  Drive an automobile Make any legal decisions Drink any alcoholic beverage   You may resume regular meals tomorrow.  Today it is better to start with liquids and gradually work up to solid foods.  You may eat anything you prefer, but it is better to start with liquids, then soup and crackers, and gradually work up to solid foods.   Please notify your doctor immediately if you have any unusual bleeding, trouble breathing, redness and pain at the surgery site, drainage, fever, or pain not relieved by medication.    Additional Instructions:        Please contact your physician with any problems or Same Day Surgery at [phone], Monday  through Friday 6 am to 4 pm, or [facility] number at [phone]. 

## 2022-11-12 NOTE — Anesthesia Postprocedure Evaluation (Signed)
Anesthesia Post Note  Patient: Tina Henderson  Procedure(s) Performed: BREAST BIOPSY WITH RADIO FREQUENCY LOCALIZER (Right: Breast)  Patient location during evaluation: PACU Anesthesia Type: General Level of consciousness: awake and alert Pain management: pain level controlled Vital Signs Assessment: post-procedure vital signs reviewed and stable Respiratory status: spontaneous breathing, nonlabored ventilation, respiratory function stable and patient connected to nasal cannula oxygen Cardiovascular status: blood pressure returned to baseline and stable Postop Assessment: no apparent nausea or vomiting Anesthetic complications: no   There were no known notable events for this encounter.   Last Vitals:  Vitals:   11/12/22 1018 11/12/22 1030  BP: 125/84 (!) 139/93  Pulse: 75 92  Resp:  13  Temp: (!) 36.2 C   SpO2: 99% 95%    Last Pain:  Vitals:   11/12/22 0831  TempSrc: Oral                 Ilene Qua

## 2022-11-12 NOTE — Interval H&P Note (Signed)
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  The various methods of treatment have been discussed with the patient and family. After consideration of risks, benefits and other options for treatment, the patient has consented to  Procedure(s): BREAST BIOPSY WITH RADIO FREQUENCY LOCALIZER (Right) as a surgical intervention.  The patient's history has been reviewed, patient examined, no change in status, stable for surgery.  I have reviewed the patient's chart and labs.  Questions were answered to the patient's satisfaction.     Herbert Pun

## 2022-11-13 ENCOUNTER — Encounter: Payer: Self-pay | Admitting: General Surgery

## 2022-11-15 LAB — SURGICAL PATHOLOGY

## 2023-12-12 ENCOUNTER — Other Ambulatory Visit: Payer: Self-pay | Admitting: Physician Assistant

## 2023-12-12 DIAGNOSIS — Z1231 Encounter for screening mammogram for malignant neoplasm of breast: Secondary | ICD-10-CM

## 2023-12-13 NOTE — Progress Notes (Signed)
 Chief Complaint  Patient presents with  . Annual Exam   Subjective:   Tina Henderson is a 53 y.o. female here for a health maintenance visit.  Patient is an established pt Additional concerns addressed today:   HPI History of Present Illness Tina Henderson is a 52 year old female who presents for an annual physical exam.  She works part-time and engages in activities such as cleaning, crocheting, reading, and running. She walks a couple of times a week and mentions that her diet has improved as her husband is on a carnivore diet. She is up to date with her dental and eye exams, and her mammogram is scheduled in two weeks.  She has a history of lower back issues and uses lidocaine  patches for relief when needed. She has diabetes with an A1c currently at 6.6 and is on metformin  for management. She previously tried Ozempic but experienced nausea. Her blood sugar was slightly elevated at 126 mg/dL. Her cholesterol levels are well-controlled with a total cholesterol of 158 mg/dL, LDL at 93 mg/dL, and HDL at 51 mg/dL. She takes a half pill of rosuvastatin  for cholesterol management.  She reports experiencing pain in her right elbow for about six months, described as persistent and located on the outside of the elbow. The pain is exacerbated by certain movements, such as turning a doorknob, and is particularly bothersome at night. No numbness in her hand. She has a history of cervical radiculopathy, for which she had surgery, but states that this pain feels different.  Her daughter, who is 83 years old, has recently had a couple of seizures, with the first occurring in October and the second in March. Her daughter is undergoing further evaluation, including an EEG and brain scans, and is currently on Keppra. She does not have a family history of ovarian cancer, but her father had lung and bone cancer, and her grandparents and father had heart attacks. She is concerned about cancer screening as she  ages.  Anxiety-  Stable on citalopram  30 mg daily. Using prn xanax   Reflux-on pantoprazole  and stable. C5-7 ACDF on 07/13/21. - currently no pain, doing well Intraductal papilloma- right breast.  diagnosed 11/2022   Patient Active Problem List  Diagnosis  . Acid reflux  . Airway hyperreactivity (HHS-HCC)  . Allergic rhinitis  . Anxiety disorder  . Avitaminosis D, unspecified  . Bilateral polycystic ovarian syndrome  . BMI 40.0-44.9, adult (CMS/HHS-HCC)  . Clinical depression  . Hypertension associated with diabetes (CMS/HHS-HCC)  . Hyperlipidemia associated with type 2 diabetes mellitus , unspecified (CMS-HCC)  . Restless leg  . Type 2 diabetes mellitus without complication, without long-term current use of insulin  (CMS/HHS-HCC)     Current Outpatient Medications:  .  ALPRAZolam  (XANAX ) 0.5 MG tablet, Take 1 tablet (0.5 mg total) by mouth once daily as needed, Disp: 30 tablet, Rfl: 0 .  citalopram  (CELEXA ) 20 MG tablet, Take 1.5 tablets (30 mg total) by mouth once daily for 180 days, Disp: 135 tablet, Rfl: 1 .  cyanocobalamin (VITAMIN B12) 1,000 mcg/mL injection, INJECT 1 ML (1,000 MCG TOTAL) INTO THE MUSCLE MONTHLY, Disp: 3 mL, Rfl: 1 .  fluticasone  propionate (FLONASE ) 50 mcg/actuation nasal spray, Place 2 sprays into both nostrils once daily, Disp: 70 g, Rfl: 2 .  loratadine  (CLARITIN ) 10 mg tablet, Take 10 mg by mouth once daily.  , Disp: , Rfl:  .  losartan -hydroCHLOROthiazide  (HYZAAR) 50-12.5 mg tablet, Take 1 tablet by mouth once daily, Disp: 90 tablet,  Rfl: 1 .  MELATONIN ORAL, Take by mouth at bedtime, Disp: , Rfl:  .  metFORMIN  (GLUCOPHAGE ) 1000 MG tablet, TAKE 1 TABLET BY MOUTH TWICE A DAY WITH MEALS, Disp: 180 tablet, Rfl: 1 .  pantoprazole  (PROTONIX ) 40 MG DR tablet, TAKE 1 TABLET BY MOUTH EVERY DAY, Disp: 90 tablet, Rfl: 1 .  rosuvastatin  (CRESTOR ) 10 MG tablet, TAKE 1 TABLET BY MOUTH EVERY DAY (Patient taking differently: Take 10 mg by mouth once daily Take 0.5 (5mg   total) tablet daily), Disp: 90 tablet, Rfl: 1 .  VITAMIN D3 ORAL, Take by mouth once daily Take 1 gummy daily, Disp: , Rfl:  .  BD INTEGRA SYRINGE 3 mL 25 gauge x 1 Syrg, as directed, Disp: , Rfl:  .  lidocaine  (LIDODERM ) 5 % patch, Place 1 patch onto the skin daily Apply patch to the most painful area for up to 12 hours in a 24 hour period., Disp: 30 patch, Rfl: 3 .  ONETOUCH ULTRA BLUE TEST STRIP test strip, USE 3 (THREE) TIMES DAILY. ONE TOUCH ULTRA-DX: E11.65, Disp: 100 each, Rfl: 9 .  syringe with needle 1 mL 25 gauge x 1 syringe, Use as directed, Disp: 10 each, Rfl: 0    Social Drivers of Health with Concerns   Housing Stability: Unknown (12/13/2023)   Housing Stability Vital Sign   . Unable to Pay for Housing in the Last Year: No   . Homeless in the Last Year: No      12/13/2023  [employer] Authorization for Release of Information - [employer]   Are any of your needs urgent? No  Would you like help with any of the needs that you have identified? No    Social History   Tobacco Use  . Smoking status: Never  . Smokeless tobacco: Never  Vaping Use  . Vaping status: Never Used  Substance Use Topics  . Alcohol use: No  . Drug use: Never   Other Health Habits: Exercise: 2-3 times/week on average Current exercise activities: walking Diet: in general, a healthy diet   Dental Exam: up to date Eye Exam: up to date  GYN: Sexual Health LMP: No LMP recorded. Patient has had a hysterectomy. Patient has no period. Menopausal or had hysterectomy Last pap smear: see HM section History of abnormal pap smears: No Sexually active: Yes with female partner Current contraception: status post hysterectomy  Health Maintenance: See under health Maintenance activity for review of completion dates as well. Immunization History  Administered Date(s) Administered  . Influenza IIV4, IM PF (3 Yr+) (AFLURIA QUAD) 05/29/2020  . Influenza IIV4, cell derived (Egg-Free) PF (6 mo+) (Flucelvax QUAD)  07/14/2018, 07/19/2019  . Influenza, IM unspecified 06/14/2015, 06/13/2016  . TDAP (>=107YR) VACCINE (ADACEL/BOOSTRIX) 07/27/2011, 06/15/2017    Health Maintenance Topics with due status: Overdue     Topic Date Due   HIV Screen Never done   Mammogram 09/22/2023   Health Maintenance Topics with due status: Postponed     Topic Postponed Until   Peak Flow/Spirometry 12/20/2023 (Originally [birth year]/1976)   Shingrix 06/13/2024 (Originally 03/13/2021)   Pneumococcal Vaccine: 50+ 12/12/2024 (Originally 03/13/1990)   Health Maintenance Topics with due status: Not Due     Topic Last Completion Date   Adult Tetanus (Td And Tdap) 06/15/2017   Influenza Vaccine 05/29/2020   Colorectal Cancer Screening 01/26/2022   Eye Exam 06/23/2023   Potassium Level 12/06/2023   Lipid Panel 12/06/2023   Hemoglobin A1C 12/06/2023   Serum Calcium  12/06/2023   Monofilament Foot  Exam 12/13/2023   Diabetes Education 12/13/2023   Depression Screening 12/13/2023   Annual Visit/Physical/Well Child Check 12/13/2023   Health Maintenance Topics with due status: Aged Out     Topic Date Due   Hib Vaccines Aged Out   Hepatitis A Vaccines Aged Out   Meningococcal B Vaccine Aged Out   Meningococcal ACWY Vaccine Aged Out   HPV Vaccines Aged Out   Health Maintenance Topics with due status: Discontinued     Topic Date Due   COVID-19 Vaccine Discontinued   Pap Smear Discontinued   Annual Urine Albumin Creatinine Ratio Discontinued   Hepatitis C Screen Discontinued    Depression Screen-PHQ2/9 PHQ-2 PHQ-2 Over the last 2 weeks, how often have you been bothered by any of the following problems? Little interest or pleasure in doing things: Not at all Feeling down, depressed, or hopeless: Not at all Patient Health Questionnaire-2 Score: 0  PHQ-9 (if PHQ >=3)    PHQ-2 Interpretation Values between 0-3 are considered not significant for depression  PHQ-9 Interpretation and Treatment Recommendations:  0-4= None   5-9= Mild / Treatment: Support, educate to call if worse; return in one month  10-14= Moderate / Treatment: Support, watchful waiting; Antidepressant or Psychotherapy  15-19= Moderately severe / Treatment: Antidepressant OR Psychotherapy  >= 20 = Major depression, severe / Antidepressant AND Psychotherapy  ROS   Objective:   Vitals:   12/13/23 0855  BP: 130/80  Pulse: 102  SpO2: 97%  Height: 157.5 cm (5' 2.01)  PainSc: 0-No pain   Body mass index is 45.16 kg/m. Home vitals:     Physical Exam MUSCULOSKELETAL: Tenderness at lateral epicondyle of elbow. Physical Exam Wt Readings from Last 3 Encounters:  06/13/23 (!) 112 kg (247 lb)  12/08/22 (!) 110.2 kg (243 lb)  11/26/22 (!) 111.6 kg (246 lb)   Constitutional: alert and in NAD Conversation: normal and appropriate HEENT: EOMI, external ear canals normal, tympanic membranes normal, and pupils equal and round Skin: normal Oropharynx: mucous membranes moist Neck: supple and no thyroid  enlargement or cervical adenopathy Respiratory: clear to auscultation, without rales or wheezes Cardiovascular: regular rate and rhythm and without murmurs, rubs or gallops Abdomen: soft, nontender, nondistended, and no hepatosplenomegaly Musculoskeletal: age appropriate ROM of shoulders, hips and knees Extremities: no lower extremity edema, dorsalis pedis pulses normal Neurological: grossly intact and normal gait  Results LABS A1c: 6.6% Urinalysis: trace protein, trace ketones Cholesterol: 158 mg/dL LDL: 93 mg/dL HDL: 51 mg/dL Glucose: 873 mg/dL Creatinine: normal CBC: normal  RADIOLOGY Brain MRI: completed  DIAGNOSTIC EEG: completed  Appointment on 12/06/2023  Component Date Value Ref Range Status  . WBC (White Blood Cell Count) 12/06/2023 8.0  4.1 - 10.2 10^3/uL Final  . RBC (Red Blood Cell Count) 12/06/2023 4.43  4.04 - 5.48 10^6/uL Final  . Hemoglobin 12/06/2023 12.3  12.0 - 15.0 gm/dL Final  . Hematocrit 96/74/7974  37.7  35.0 - 47.0 % Final  . MCV (Mean Corpuscular Volume) 12/06/2023 85.1  80.0 - 100.0 fl Final  . MCH (Mean Corpuscular Hemoglobin) 12/06/2023 27.8  27.0 - 31.2 pg Final  . MCHC (Mean Corpuscular Hemoglobin * 12/06/2023 32.6  32.0 - 36.0 gm/dL Final  . Platelet Count 12/06/2023 281  150 - 450 10^3/uL Final  . RDW-CV (Red Cell Distribution Widt* 12/06/2023 12.9  11.6 - 14.8 % Final  . MPV (Mean Platelet Volume) 12/06/2023 10.8  9.4 - 12.4 fl Final  . Neutrophils 12/06/2023 5.02  1.50 - 7.80 10^3/uL Final  .  Lymphocytes 12/06/2023 2.22  1.00 - 3.60 10^3/uL Final  . Monocytes 12/06/2023 0.50  0.00 - 1.50 10^3/uL Final  . Eosinophils 12/06/2023 0.16  0.00 - 0.55 10^3/uL Final  . Basophils 12/06/2023 0.03  0.00 - 0.09 10^3/uL Final  . Neutrophil % 12/06/2023 63.0  32.0 - 70.0 % Final  . Lymphocyte % 12/06/2023 27.9  10.0 - 50.0 % Final  . Monocyte % 12/06/2023 6.3  4.0 - 13.0 % Final  . Eosinophil % 12/06/2023 2.0  1.0 - 5.0 % Final  . Basophil% 12/06/2023 0.4  0.0 - 2.0 % Final  . Immature Granulocyte % 12/06/2023 0.4  <=0.7 % Final  . Immature Granulocyte Count 12/06/2023 0.03  <=0.06 10^3/L Final  . Glucose 12/06/2023 126 (H)  70 - 110 mg/dL Final  . Sodium 96/74/7974 138  136 - 145 mmol/L Final  . Potassium 12/06/2023 4.2  3.6 - 5.1 mmol/L Final  . Chloride 12/06/2023 102  97 - 109 mmol/L Final  . Carbon Dioxide (CO2) 12/06/2023 31.0  22.0 - 32.0 mmol/L Final  . Urea Nitrogen (BUN) 12/06/2023 17  7 - 25 mg/dL Final  . Creatinine 96/74/7974 0.5 (L)  0.6 - 1.1 mg/dL Final  . Glomerular Filtration Rate (eGFR) 12/06/2023 113  >60 mL/min/1.73sq m Final  . Calcium  12/06/2023 8.8  8.7 - 10.3 mg/dL Final  . AST  96/74/7974 17  8 - 39 U/L Final  . ALT  12/06/2023 20  5 - 38 U/L Final  . Alk Phos (alkaline Phosphatase) 12/06/2023 72  34 - 104 U/L Final  . Albumin 12/06/2023 4.1  3.5 - 4.8 g/dL Final  . Bilirubin, Total 12/06/2023 0.3  0.3 - 1.2 mg/dL Final  . Protein, Total 12/06/2023 6.4   6.1 - 7.9 g/dL Final  . A/G Ratio 96/74/7974 1.8  1.0 - 5.0 gm/dL Final  . Cholesterol, Total 12/06/2023 158  100 - 200 mg/dL Final  . Triglyceride 96/74/7974 68  35 - 199 mg/dL Final  . HDL (High Density Lipoprotein) Cho* 12/06/2023 51.3  35.0 - 85.0 mg/dL Final  . LDL Calculated 12/06/2023 93  0 - 130 mg/dL Final  . VLDL Cholesterol 12/06/2023 14  mg/dL Final  . Cholesterol/HDL Ratio 12/06/2023 3.1   Final  . Color 12/06/2023 Yellow  Colorless, Straw, Light Yellow, Yellow, Dark Yellow Final  . Clarity 12/06/2023 Clear  Clear Final  . Specific Gravity 12/06/2023 1.026  1.005 - 1.030 Final  . pH, Urine 12/06/2023 7.0  5.0 - 8.0 Final  . Protein, Urinalysis 12/06/2023 Trace (!)  Negative mg/dL Final  . Glucose, Urinalysis 12/06/2023 Negative  Negative mg/dL Final  . Ketones, Urinalysis 12/06/2023 Trace (!)  Negative mg/dL Final  . Blood, Urinalysis 12/06/2023 Negative  Negative Final  . Nitrite, Urinalysis 12/06/2023 Negative  Negative Final  . Leukocyte Esterase, Urinalysis 12/06/2023 Negative  Negative Final  . Bilirubin, Urinalysis 12/06/2023 Negative  Negative Final  . Urobilinogen, Urinalysis 12/06/2023 0.2  0.2 - 1.0 mg/dL Final  . WBC, UA 96/74/7974 1  <=5 /hpf Final  . Red Blood Cells, Urinalysis 12/06/2023 <1  <=3 /hpf Final  . Bacteria, Urinalysis 12/06/2023 0-5  0 - 5 /hpf Final  . Squamous Epithelial Cells, Urinaly* 12/06/2023 12  /hpf Final  . Hemoglobin A1C 12/06/2023 6.6 (H)  4.2 - 5.6 % Final  . Average Blood Glucose (Calc) 12/06/2023 143  mg/dL Final    Assessment/Plan:   Patient was seen for a health maintenance exam.  Counseled the patient on health maintenance issues.  Reviewed her health maintenance schedule and ordered appropriate tests. Counseled on regular exercise and weight management. Recommend regular eye exams and dental cleaning.   The following issues were addressed today for health maintenance: Bone density recommended.  Colon cancer screening options  reviewed Diet - reviewed healthy diet and weight management Diet - discussed weight loss strategies Discussed importance of aerobic exercise and strength training Dermatologist skin exam recommended Encouraged to increase or continue intake of vitamin D  (goal 1000-2000 Int Units/day) Encouraged to schedule routine dental follow up Encouraged to schedule routine eye check Follow up for annual exam in one year Recommended sunscreen usage  The patient's BMI is above the acceptable range; discussed or provided materials on diet/exercise  Assessment & Plan Lateral epicondylitis (tennis elbow) Chronic pain in the right elbow for six months, localized on the lateral aspect, likely due to repetitive motion such as typing or twisting. Pain is more pronounced at night, possibly due to stiffness. - Provide a handout on lateral epicondylitis. - Recommend use of a brace to limit twisting motions. - Suggest over-the-counter ibuprofen or naproxen for pain management. - Advise application of ice and lidocaine  gel on the affected area. - Consider referral to orthopedics for potential steroid injection if symptoms persist.  Type 2 diabetes mellitus A1c is 6.6, slightly increased but within acceptable range. Blood sugar level is 126. She is on metformin  and previously tried Ozempic, which caused nausea. Emphasized maintaining blood sugar control through medication and lifestyle changes. - Continue metformin  1000 mg oral twice a day with meals. - Monitor blood sugar levels regularly. - Encourage dietary management and regular exercise.  Hypertension Blood pressure is well-controlled with current medication regimen. - Continue losartan -hydrochlorothiazide  50-12.5 mg oral once daily.  Hyperlipidemia Cholesterol levels are well-controlled with current medication. Total cholesterol is 158, LDL is 93, and HDL is 51. - Continue rosuvastatin  10 mg oral daily.  General Health Maintenance She is due for a  mammogram and has an upcoming dental appointment. Eye exam is scheduled for the end of the year. Discussed pneumonia and shingles vaccinations, including the benefits of Prevnar 20 for pneumonia and shingles vaccine for shingles prevention. - Order mammogram. - Discuss pneumonia and shingles vaccinations. - Recommend dermatology consultation for a full body skin exam.  Follow-up Routine follow-up and monitoring of chronic conditions. - Schedule follow-up appointment in six months with repeat labs. Diagnoses and all orders for this visit:  Annual physical exam  Type 2 diabetes mellitus without complication, without long-term current use of insulin  (CMS/HHS-HCC) -     Comprehensive Metabolic Panel (CMP); Future -     Urinalysis w/Microscopic; Future -     Hemoglobin A1C; Future  Hypertension associated with diabetes (CMS/HHS-HCC) -     CBC w/auto Differential (5 Part); Future -     Comprehensive Metabolic Panel (CMP); Future  Hyperlipidemia due to type 2 diabetes mellitus  (CMS/HHS-HCC) -     Lipid Panel w/calc LDL; Future  BMI 40.0-44.9, adult (CMS/HHS-HCC)  Encounter for screening mammogram for breast cancer -     Mammo screening digital bilateral; Future  Mild intermittent asthma, unspecified whether complicated (HHS-HCC)  Other orders -     Follow up in Primary Care -     ALPRAZolam  (XANAX ) 0.5 MG tablet; Take 1 tablet (0.5 mg total) by mouth once daily as needed -     Follow up in Primary Care; Future -     lidocaine  (LIDODERM ) 5 % patch; Place 1 patch onto the skin daily Apply  patch to the most painful area for up to 12 hours in a 24 hour period.     Plan:    PE performed. Health maintenance reviewed and updated. Reviewed labs. Continue follow up with specialists as need.   This visit was coded based on medical decision making (MDM).     Return in about 6 months (around 06/13/2024) for reck with labs. Follow-up     Normal Orders This Visit   Follow up in Primary  Care [[id number] Custom]    Questions:   Does this order need to be coordinated with another visit or should it be hidden from the patient portal? If yes to either, the patient will need to stop by the front desk or call to schedule.: No   Who is this follow-up with?: Me   Can this appointment be overbooked by a scheduler?: No   What type of follow up is needed?: Physical   What's the reason for follow up?:     Future Labs/Procedures Expected by Expires   Follow up in Primary Care [[id number] Custom]  06/13/2024 08/14/2024   Questions:     Does this order need to be coordinated with another visit or should it be hidden from the patient portal? If yes to either, the patient will need to stop by the front desk or call to schedule.: No   Who is this follow-up with?: Me   Can this appointment be overbooked by a scheduler?: No   What type of follow up is needed?: Office Visit   What's the reason for follow up?: Chronic Medical Problems   Allow telemedicine?: In Person Only         Future Appointments     Date/Time Provider Department Center Visit Type   06/06/2024 8:30 AM [facility] C LAB   06/13/2024 1:30 PM Marikay Eva Hausen, PA [facility] OFFICE VISIT       An after visit summary was provided for the patient either in written format or through MyChart.  This note has been created using automated tools and reviewed for accuracy by MIRIAM KLEM MCLAUGHLIN.  Eva Marikay, PA-C

## 2023-12-27 ENCOUNTER — Encounter

## 2024-01-16 ENCOUNTER — Ambulatory Visit
Admission: RE | Admit: 2024-01-16 | Discharge: 2024-01-16 | Disposition: A | Discharge status: Home / Self Care | Source: Ambulatory Visit | Attending: Physician Assistant | Admitting: Physician Assistant

## 2024-01-16 DIAGNOSIS — Z1231 Encounter for screening mammogram for malignant neoplasm of breast: Secondary | ICD-10-CM | POA: Diagnosis present

## 2024-06-02 ENCOUNTER — Other Ambulatory Visit: Payer: Self-pay

## 2024-06-02 ENCOUNTER — Encounter: Payer: Self-pay | Admitting: *Deleted

## 2024-06-02 ENCOUNTER — Emergency Department
Admission: EM | Admit: 2024-06-02 | Discharge: 2024-06-02 | Disposition: A | Discharge status: Home / Self Care | Attending: Emergency Medicine | Admitting: Emergency Medicine

## 2024-06-02 DIAGNOSIS — H5789 Other specified disorders of eye and adnexa: Secondary | ICD-10-CM | POA: Diagnosis present

## 2024-06-02 DIAGNOSIS — I10 Essential (primary) hypertension: Secondary | ICD-10-CM | POA: Diagnosis not present

## 2024-06-02 DIAGNOSIS — H1032 Unspecified acute conjunctivitis, left eye: Secondary | ICD-10-CM | POA: Insufficient documentation

## 2024-06-02 DIAGNOSIS — E119 Type 2 diabetes mellitus without complications: Secondary | ICD-10-CM | POA: Diagnosis not present

## 2024-06-02 MED ORDER — CIPROFLOXACIN HCL 0.3 % OP SOLN: 2.0000 [drp] | OPHTHALMIC | 0 refills | Status: AC

## 2024-06-02 MED ORDER — CIPROFLOXACIN HCL 0.3 % OP SOLN
2.0000 [drp] | Freq: Once | OPHTHALMIC | Status: AC
  Administered 2024-06-02: 2 [drp] via OPHTHALMIC
  Filled 2024-06-02: qty 2.5

## 2024-06-02 MED ORDER — KETOROLAC TROMETHAMINE 0.5 % OP SOLN: 1.0000 [drp] | Freq: Four times a day (QID) | OPHTHALMIC | 0 refills | Status: AC

## 2024-06-02 MED ORDER — TETRACAINE HCL 0.5 % OP SOLN
2.0000 [drp] | Freq: Once | OPHTHALMIC | Status: AC
  Administered 2024-06-02: 2 [drp] via OPHTHALMIC
  Filled 2024-06-02: qty 4

## 2024-06-02 MED ORDER — FLUORESCEIN SODIUM 1 MG OP STRP
1.0000 | ORAL_STRIP | Freq: Once | OPHTHALMIC | Status: AC
  Administered 2024-06-02: 1 via OPHTHALMIC
  Filled 2024-06-02: qty 1

## 2024-06-02 NOTE — ED Triage Notes (Signed)
 Pt woke up yesterday with drainage from left eye.  This am she woke up this am with pain and drainage in left eye.  Pt is sensitive to light.

## 2024-06-02 NOTE — ED Provider Notes (Signed)
 [facility] Provider Note    Event Date/Time   First MD Initiated Contact with Patient 06/02/24 0813     (approximate)   History   Conjunctivitis   HPI  Tina Henderson is a 53 y.o. female with history of hypertension, PCOS, type 2 diabetes and as listed in EMR presents to the emergency department for treatment and evaluation of drainage from left eye x 2 days.  Upon awakening this morning, she was having pain in addition to the drainage and is sensitive to light. When eye is open, vision is blurry.  She does not wear contact lenses.     Physical Exam    Vitals:   06/02/24 0757 06/02/24 0800  BP: (!) 171/106   Pulse: 79   Resp: 17   Temp: 98.2 F (36.8 C)   SpO2: 96% 96%    General: Awake, no distress.  CV:  Good peripheral perfusion.  Resp:  Normal effort.  Abd:  No distention.  Other:  Clear watery drainage from left eye. Conjunctiva is erythematous.   ED Results / Procedures / Treatments   Labs (all labs ordered are listed, but only abnormal results are displayed)  Labs Reviewed - No data to display   EKG  Not indicated.   RADIOLOGY  Image and radiology report reviewed and interpreted by me. Radiology report consistent with the same.  Not indicated  PROCEDURES:  Critical Care performed: No  Procedures fluorescein  stain eye exam performed.  No corneal abrasions noted.  Magnified light exam performed.  No noted retained foreign bodies.  Tono-Pen exam with pressures measuring 14, 16, 18, 16   MEDICATIONS ORDERED IN ED:  Medications  tetracaine  (PONTOCAINE) 0.5 % ophthalmic solution 2 drop (2 drops Right Eye Given 06/02/24 0834)  fluorescein  ophthalmic strip 1 strip (1 strip Right Eye Given 06/02/24 0834)  ciprofloxacin  (CILOXAN ) 0.3 % ophthalmic solution 2 drop (2 drops Left Eye Given 06/02/24 0925)     IMPRESSION / MDM / ASSESSMENT AND PLAN / ED COURSE   I have reviewed the triage note and vital signs. Vital signs  stable--asymptomatic hypertension.   Differential diagnosis includes, but is not limited to, allergic conjunctivitis, viral conjunctivitis, bacterial conjunctivitis, corneal abrasion, retained foreign body, acute angle glaucoma  Patient's presentation is most consistent with acute illness / injury with system symptoms.  53 year old female presenting to the emergency department for evaluation of the left eye redness, gritty sensation, and watery drainage.  See HPI for further details.  Fluorescein  stain exam negative for corneal abrasion or retained foreign body.  Pressures by Tono-Pen reading are normal.  Plan will be to treat her with Acular  and ciprofloxacin  ophthalmic drops.  Ophthalmology follow-up instructions provided if not improving over the next 2 to 3 days.  She is to return to the emergency department if symptoms change or worsen and she is unable to be seen by the specialist.      FINAL CLINICAL IMPRESSION(S) / ED DIAGNOSES   Final diagnoses:  Acute conjunctivitis of left eye, unspecified acute conjunctivitis type     Rx / DC Orders   ED Discharge Orders          Ordered    ketorolac  (ACULAR ) 0.5 % ophthalmic solution  4 times daily        06/02/24 0846    ciprofloxacin  (CILOXAN ) 0.3 % ophthalmic solution  Every 4 hours while awake        06/02/24 0846  Note:  This document was prepared using Dragon voice recognition software and may include unintentional dictation errors.   Herlinda Kirk NOVAK, FNP 06/02/24 1312    Arlander Charleston, MD 06/02/24 [phone]

## 2024-07-13 ENCOUNTER — Emergency Department

## 2024-07-13 ENCOUNTER — Emergency Department: Admission: EM | Admit: 2024-07-13 | Discharge: 2024-07-13 | Disposition: A | Discharge status: Home / Self Care

## 2024-07-13 ENCOUNTER — Other Ambulatory Visit: Payer: Self-pay

## 2024-07-13 DIAGNOSIS — J45909 Unspecified asthma, uncomplicated: Secondary | ICD-10-CM | POA: Insufficient documentation

## 2024-07-13 DIAGNOSIS — E119 Type 2 diabetes mellitus without complications: Secondary | ICD-10-CM | POA: Diagnosis not present

## 2024-07-13 DIAGNOSIS — R519 Headache, unspecified: Secondary | ICD-10-CM | POA: Diagnosis present

## 2024-07-13 DIAGNOSIS — I10 Essential (primary) hypertension: Secondary | ICD-10-CM | POA: Insufficient documentation

## 2024-07-13 LAB — BASIC METABOLIC PANEL WITH GFR
Anion gap: 11 (ref 5–15)
BUN: 17 mg/dL (ref 6–20)
CO2: 23 mmol/L (ref 22–32)
Calcium: 9 mg/dL (ref 8.9–10.3)
Chloride: 100 mmol/L (ref 98–111)
Creatinine, Ser: 0.5 mg/dL (ref 0.44–1.00)
GFR, Estimated: >60 mL/min (ref >60)
Glucose, Bld: 145 mg/dL — ABNORMAL HIGH (ref 70–99)
Potassium: 3.5 mmol/L (ref 3.5–5.1)
Sodium: 134 mmol/L — ABNORMAL LOW (ref 135–145)

## 2024-07-13 LAB — CBC
HCT: 37.8 % (ref 36.0–46.0)
Hemoglobin: 12.2 g/dL (ref 12.0–15.0)
MCH: 27.5 pg (ref 26.0–34.0)
MCHC: 32.3 g/dL (ref 30.0–36.0)
MCV: 85.3 fL (ref 80.0–100.0)
Platelets: 292 K/uL (ref 150–400)
RBC: 4.43 MIL/uL (ref 3.87–5.11)
RDW: 13 % (ref 11.5–15.5)
WBC: 11.6 K/uL — ABNORMAL HIGH (ref 4.0–10.5)
nRBC: 0 % (ref 0.0–0.2)

## 2024-07-13 LAB — TROPONIN I (HIGH SENSITIVITY)
Troponin I (High Sensitivity): 4 ng/L (ref <18)
Troponin I (High Sensitivity): 5 ng/L (ref <18)

## 2024-07-13 MED ORDER — AMLODIPINE BESYLATE 5 MG PO TABS
5.0000 mg | ORAL_TABLET | Freq: Once | ORAL | Status: AC
  Administered 2024-07-13: 5 mg via ORAL
  Filled 2024-07-13: qty 1

## 2024-07-13 MED ORDER — IBUPROFEN 600 MG PO TABS
600.0000 mg | ORAL_TABLET | Freq: Once | ORAL | Status: AC
  Administered 2024-07-13: 600 mg via ORAL
  Filled 2024-07-13: qty 1

## 2024-07-13 MED ORDER — AMLODIPINE BESYLATE 5 MG PO TABS: 5.0000 mg | ORAL_TABLET | Freq: Every day | ORAL | 0 refills | Status: AC

## 2024-07-13 NOTE — Discharge Instructions (Signed)
 Please monitor your blood pressure and keep a log. Take the amlodipine as we discussed. If chest pain changes or gets worse or you develop any new symptom of concern over the weekend, please return to the emergency department.

## 2024-07-13 NOTE — ED Provider Notes (Signed)
 [facility] Provider Note    Event Date/Time   First MD Initiated Contact with Patient 07/13/24 1916     (approximate)   History   Hypertension   HPI  Tina Henderson is a 53 y.o. female with history of hypertension, PCOS, DKA, type 2 diabetes, GERD, asthma, hyperlipidemia and as listed in EMR presents to the emergency department for treatment and evaluation of blood pressure elevation and soreness in chest.  She is also experiencing a headache.  The headache has been intermittent for the past week or so.  She has no cardiac history.  No history of DVT or PE.  She has been compliant with her medications.     Physical Exam    Vitals:   07/13/24 1610 07/13/24 2031  BP: (!) 186/97 (!) 186/97  Pulse: 84 68  Resp: 20 17  Temp: 99.5 F (37.5 C) 99.1 F (37.3 C)  SpO2: 97% 99%    General: Awake, no distress.  CV:  Good peripheral perfusion.  Resp:  Normal effort.  Breath sounds are clear to auscultation. Abd:  No distention.  Other:     ED Results / Procedures / Treatments   Labs (all labs ordered are listed, but only abnormal results are displayed)  Labs Reviewed  BASIC METABOLIC PANEL WITH GFR - Abnormal; Notable for the following components:      Result Value   Sodium 134 (*)    Glucose, Bld 145 (*)    All other components within normal limits  CBC - Abnormal; Notable for the following components:   WBC 11.6 (*)    All other components within normal limits  TROPONIN I (HIGH SENSITIVITY)  TROPONIN I (HIGH SENSITIVITY)     EKG  Normal sinus rhythm with a ventricular rate of 81   RADIOLOGY  Image and radiology report reviewed and interpreted by me. Radiology report consistent with the same.  Chest x-ray is negative for acute cardiopulmonary abnormality.  PROCEDURES:  Critical Care performed: No  Procedures   MEDICATIONS ORDERED IN ED:  Medications  amLODipine (NORVASC) tablet 5 mg (5 mg Oral Given 07/13/24 2031)   ibuprofen (ADVIL) tablet 600 mg (600 mg Oral Given 07/13/24 2031)     IMPRESSION / MDM / ASSESSMENT AND PLAN / ED COURSE   I have reviewed the triage note and vital signs.  Hypertensive in triage   Differential diagnosis includes, but is not limited to, hypertensive urgency, acute coronary syndrome, pulmonary embolus, angina  Patient's presentation is most consistent with acute presentation with potential threat to life or bodily function.  The patient is on the cardiac monitor to evaluate for evidence of arrhythmia and/or significant heart rate changes.  Labs obtained while awaiting ER room assignment are reassuring.  CBC is essentially normal.  BMP shows a random glucose at 145 and a normal troponin.  Chest x-ray is normal.  EKG shows a normal sinus rhythm.  On exam, she has no pleuritic pain with deep breath.  She has no swelling or pain in the lower extremities.  No associated back pain with chest pain/soreness.  I reviewed her outside record and progress note from her annual physical in April of this year.  At that time her blood pressure was 130/80 with a heart rate of 102.  A1c at that time was 6.6.  No changes were made to her medications.  Plan will be to get the second troponin and administer a dose of amlodipine.  Second troponin is normal.  Blood pressure is down to 179/92 and heart rate has remained in the 70s with a normal sinus rhythm on bedside monitor.  Patient will be discharged home with a prescription for the amlodipine and encouraged to call and schedule appointment with her primary care provider on Monday.  She was encouraged also to keep a log of the blood pressures to take with her to the her doctor's appointment.  She was given strict ER return precautions and she will return if she develops any new symptoms of concern.      FINAL CLINICAL IMPRESSION(S) / ED DIAGNOSES   Final diagnoses:  Uncontrolled hypertension     Rx / DC Orders   ED Discharge Orders           Ordered    amLODipine (NORVASC) 5 MG tablet  Daily        07/13/24 2114             Note:  This document was prepared using Dragon voice recognition software and may include unintentional dictation errors.   Herlinda Kirk NOVAK, FNP 07/13/24 2117    Nicholaus Rolland BRAVO, MD 07/13/24 [phone]

## 2024-07-13 NOTE — ED Triage Notes (Signed)
 Patient sent over from [facility] for elevated BP readings and soreness in chest.
